# Patient Record
Sex: Female | Born: 1960 | Race: Black or African American | Hispanic: No | Marital: Married | Smoking: Current every day smoker
Health system: Southern US, Community
[De-identification: ages and names within clinical notes are randomized; demographics above are authoritative.]

## PROBLEM LIST (undated history)

## (undated) DIAGNOSIS — J42 Unspecified chronic bronchitis: Secondary | ICD-10-CM

## (undated) DIAGNOSIS — F419 Anxiety disorder, unspecified: Secondary | ICD-10-CM

## (undated) DIAGNOSIS — J40 Bronchitis, not specified as acute or chronic: Secondary | ICD-10-CM

## (undated) DIAGNOSIS — E119 Type 2 diabetes mellitus without complications: Secondary | ICD-10-CM

## (undated) DIAGNOSIS — I10 Essential (primary) hypertension: Secondary | ICD-10-CM

## (undated) HISTORY — PX: TONSILLECTOMY: SUR1361

## (undated) HISTORY — PX: CHOLECYSTECTOMY: SHX55

## (undated) HISTORY — PX: CARPAL TUNNEL RELEASE: SHX101

---

## 1997-10-22 ENCOUNTER — Encounter: Admission: RE | Admit: 1997-10-22 | Discharge: 1997-10-22 | Payer: Self-pay | Admitting: Family Medicine

## 1997-10-28 ENCOUNTER — Encounter: Admission: RE | Admit: 1997-10-28 | Discharge: 1997-10-28 | Payer: Self-pay | Admitting: Family Medicine

## 1998-01-17 ENCOUNTER — Emergency Department (HOSPITAL_COMMUNITY): Admission: EM | Admit: 1998-01-17 | Discharge: 1998-01-17 | Payer: Self-pay | Admitting: Emergency Medicine

## 1998-06-14 ENCOUNTER — Emergency Department (HOSPITAL_COMMUNITY): Admission: EM | Admit: 1998-06-14 | Discharge: 1998-06-14 | Payer: Self-pay | Admitting: Emergency Medicine

## 1998-08-03 ENCOUNTER — Emergency Department (HOSPITAL_COMMUNITY): Admission: EM | Admit: 1998-08-03 | Discharge: 1998-08-03 | Payer: Self-pay | Admitting: Emergency Medicine

## 1998-08-04 ENCOUNTER — Encounter: Admission: RE | Admit: 1998-08-04 | Discharge: 1998-08-04 | Payer: Self-pay | Admitting: Family Medicine

## 1998-08-18 ENCOUNTER — Encounter: Admission: RE | Admit: 1998-08-18 | Discharge: 1998-08-18 | Payer: Self-pay | Admitting: Family Medicine

## 1998-11-24 ENCOUNTER — Ambulatory Visit (HOSPITAL_COMMUNITY): Admission: RE | Admit: 1998-11-24 | Discharge: 1998-11-24 | Payer: Self-pay | Admitting: Internal Medicine

## 1998-11-27 ENCOUNTER — Encounter: Payer: Self-pay | Admitting: Internal Medicine

## 1998-11-27 ENCOUNTER — Ambulatory Visit (HOSPITAL_COMMUNITY): Admission: RE | Admit: 1998-11-27 | Discharge: 1998-11-27 | Payer: Self-pay | Admitting: Internal Medicine

## 2000-10-27 ENCOUNTER — Emergency Department (HOSPITAL_COMMUNITY): Admission: EM | Admit: 2000-10-27 | Discharge: 2000-10-27 | Payer: Self-pay | Admitting: Emergency Medicine

## 2000-10-27 ENCOUNTER — Encounter: Payer: Self-pay | Admitting: Emergency Medicine

## 2001-06-02 ENCOUNTER — Emergency Department (HOSPITAL_COMMUNITY): Admission: EM | Admit: 2001-06-02 | Discharge: 2001-06-02 | Payer: Self-pay | Admitting: Emergency Medicine

## 2001-10-05 ENCOUNTER — Emergency Department (HOSPITAL_COMMUNITY): Admission: EM | Admit: 2001-10-05 | Discharge: 2001-10-06 | Payer: Self-pay

## 2004-04-06 ENCOUNTER — Ambulatory Visit: Payer: Self-pay | Admitting: Family Medicine

## 2005-08-30 ENCOUNTER — Ambulatory Visit: Payer: Self-pay | Admitting: *Deleted

## 2007-11-27 ENCOUNTER — Ambulatory Visit: Payer: Self-pay | Admitting: *Deleted

## 2008-12-27 ENCOUNTER — Emergency Department: Payer: Self-pay | Admitting: Emergency Medicine

## 2009-01-05 ENCOUNTER — Inpatient Hospital Stay: Payer: Self-pay | Admitting: Surgery

## 2009-01-09 ENCOUNTER — Ambulatory Visit: Payer: Self-pay | Admitting: Surgery

## 2009-07-27 ENCOUNTER — Ambulatory Visit: Payer: Self-pay | Admitting: Orthopedic Surgery

## 2009-07-31 ENCOUNTER — Ambulatory Visit: Payer: Self-pay | Admitting: Orthopedic Surgery

## 2011-01-18 IMAGING — NM NUCLEAR MEDICINE HEPATOHBILIARY INCLUDE GB
1 series · 5 of 5 positions shown · non-contrast
Comparison: none

REASON FOR EXAM: ruq pain, gallstones
COMMENTS:

[Series 1000: gallbladder statics · 4.80mm/px · 5 of 5 slices shown]
[im 1/5]
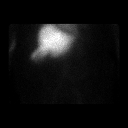
[im 2/5]
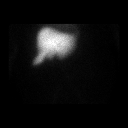
[im 3/5]
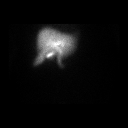
[im 4/5]
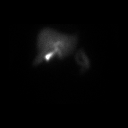
[im 5/5]
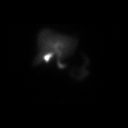

[5 of 5 positions shown; findings below may reference images not displayed]

PROCEDURE:     NM  - NM HEPATOBILIARY IMAGE  - January 06, 2009  [DATE]

RESULT:     The patient received 8.29 mCi of technetium 99m labeled Choletec
for this study. The patient has known gallstones.

There is adequate uptake of the radiopharmaceutical by the liver. The
gallbladder becomes visible by 10 minutes as is the common bile duct. Bowel
activity is clearly evident by 20 minutes.The patient was uncomfortable on
the table and the study was terminated once adequate filling of the
gallbladder and the presence of bowel activity were demonstrated.
IMPRESSION: I do not see evidence of obstruction of the cystic duct nor
evidence of common bile ductal dilation.

## 2011-01-18 IMAGING — US TRANSABDOMINAL ULTRASOUND OF PELVIS
1 series · 17 of 25 positions shown · non-contrast
Comparison: none

REASON FOR EXAM: evaluate ovarian cyst
COMMENTS:

[Series 1: transabdominal ultrasound of pelvis · 17 of 39 slices shown]
[im 1/39]
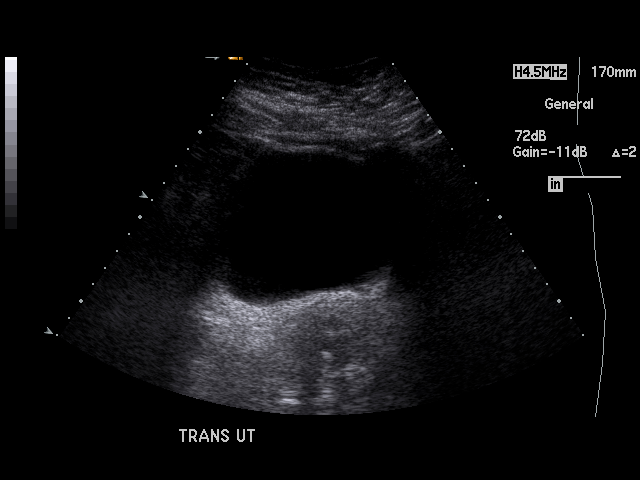
[im 4/39]
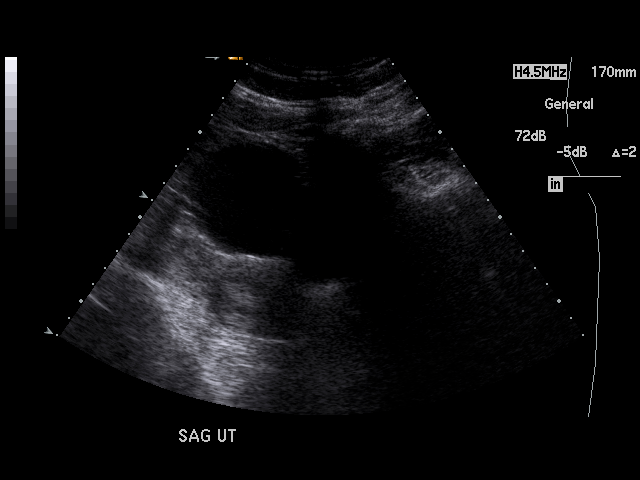
[im 5/39]
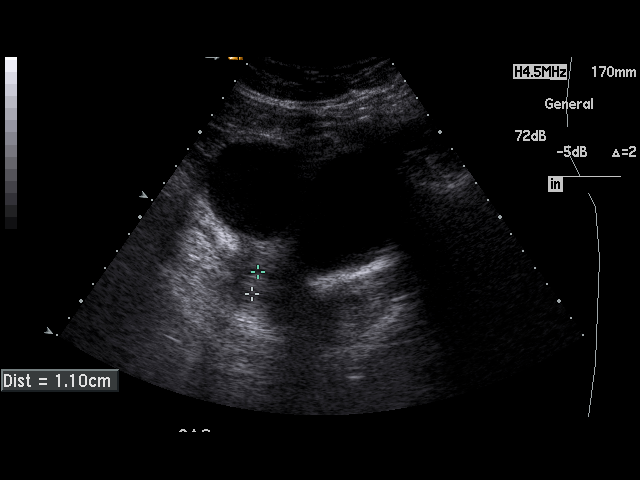
[im 8/39]
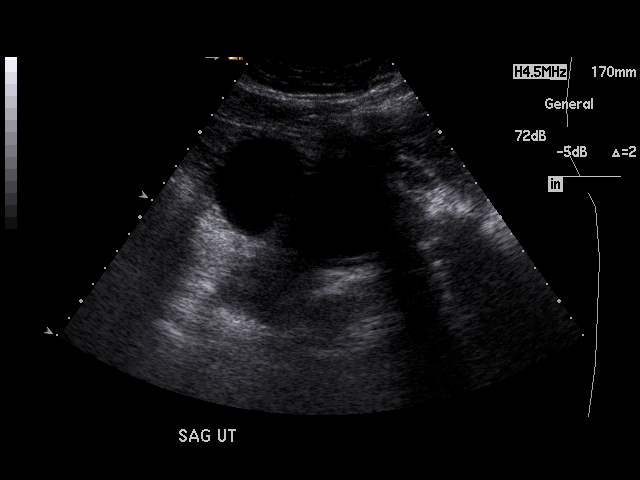
[im 10/39]
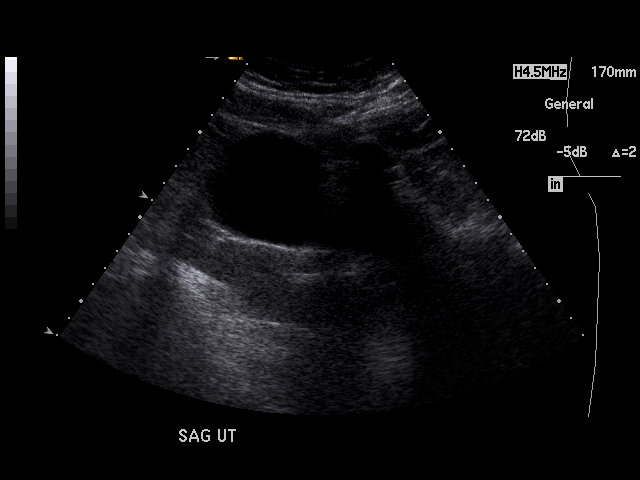
[im 13/39]
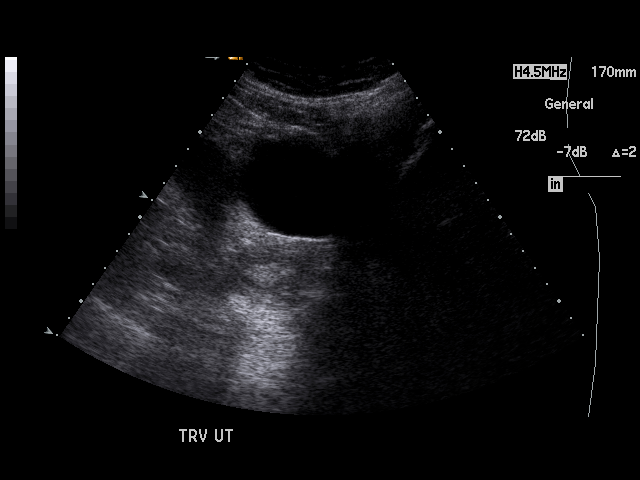
[im 15/39]
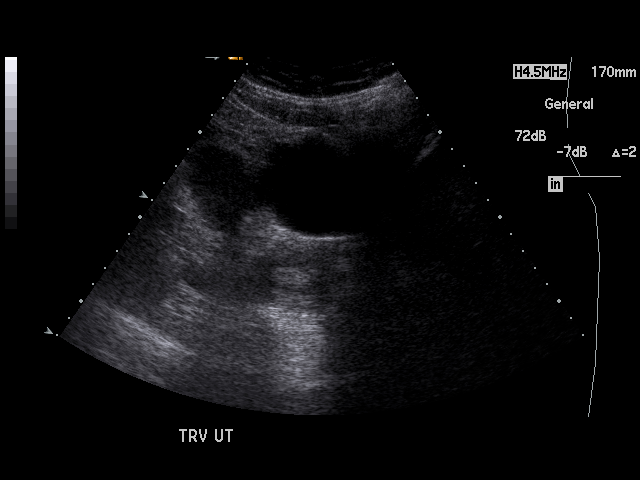
[im 18/39]
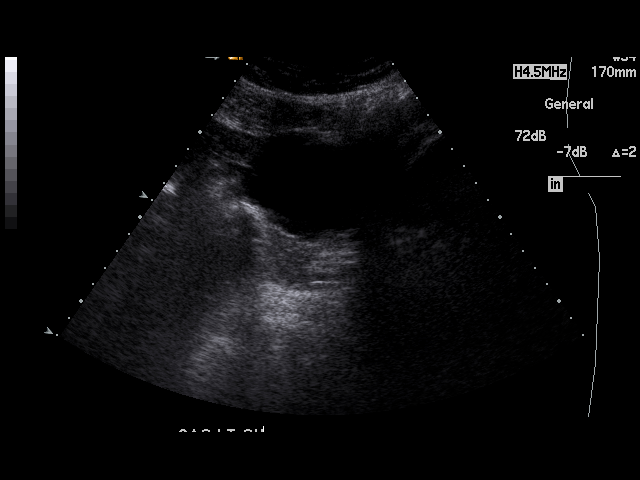
[im 20/39]
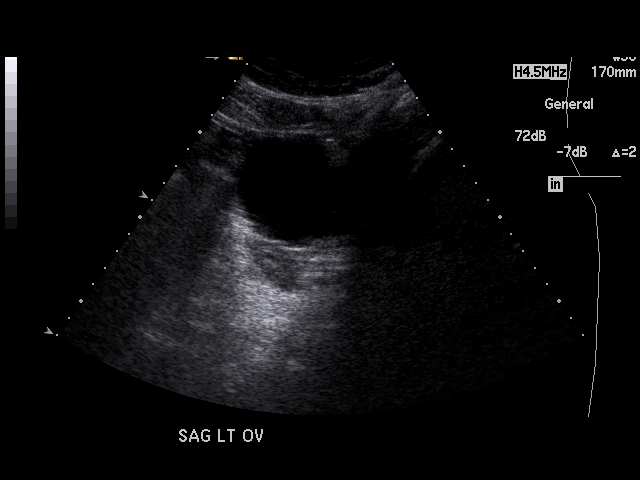
[im 21/39]
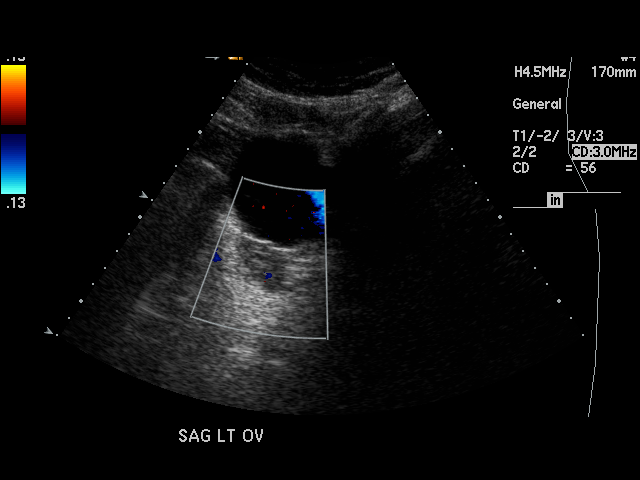
[im 24/39]
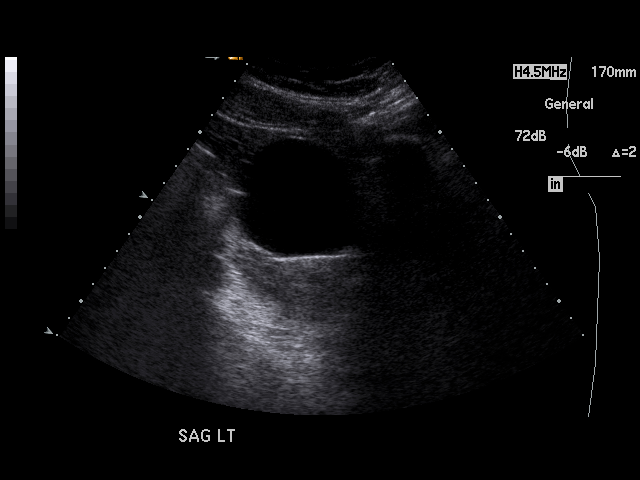
[im 26/39]
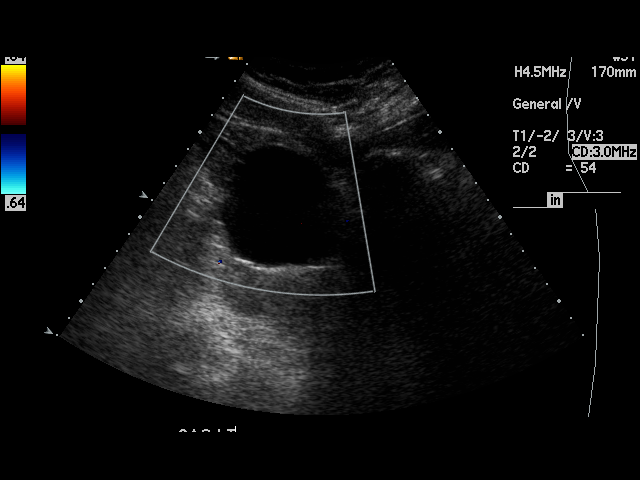
[im 29/39]
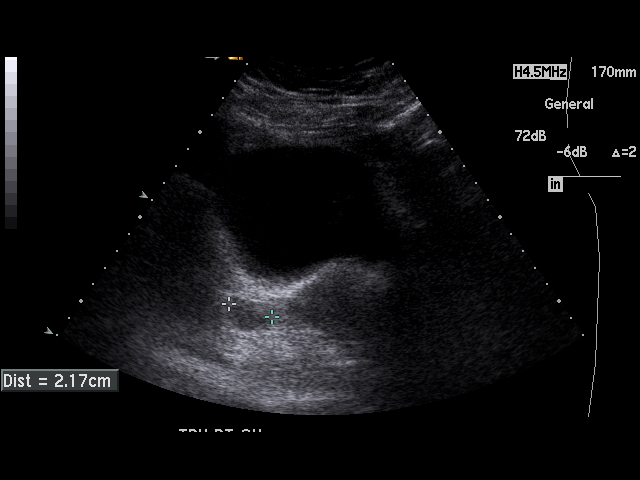
[im 31/39]
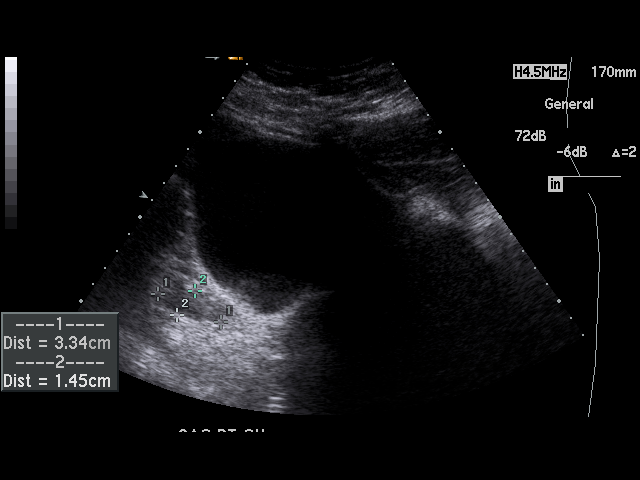
[im 34/39]
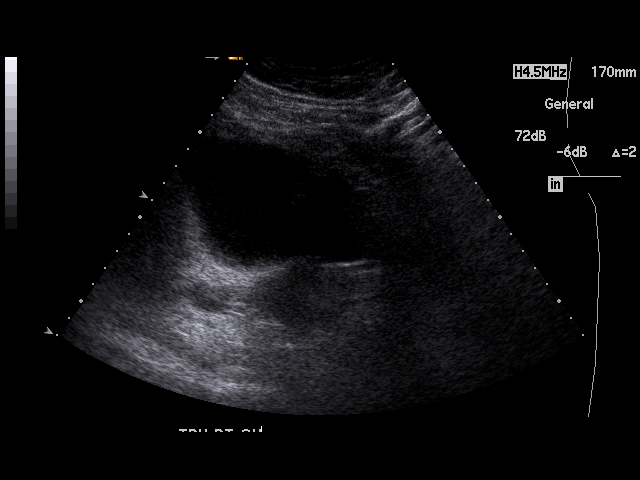
[im 35/39]
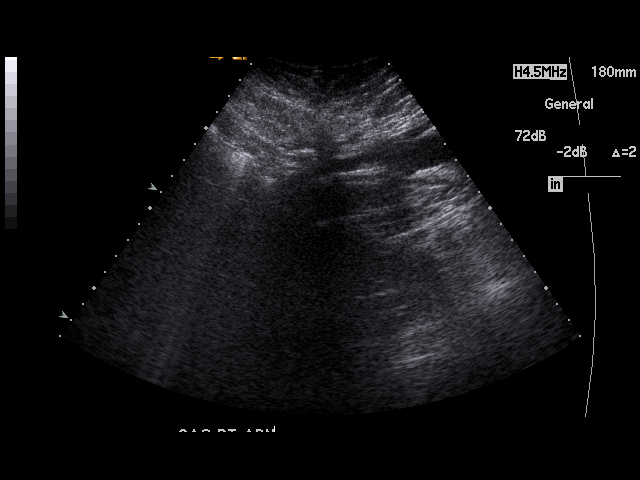
[im 39/39]
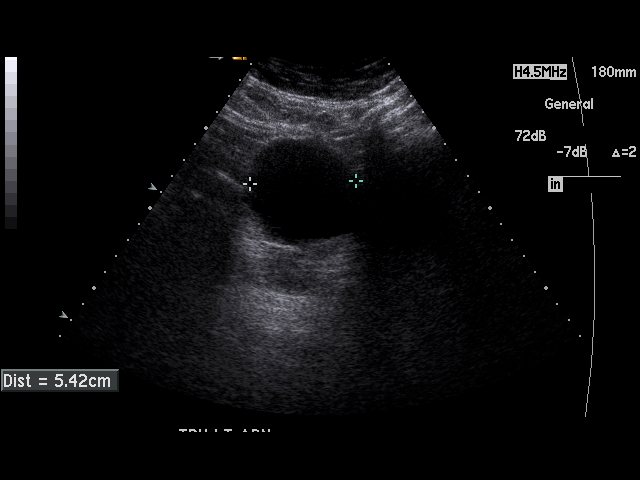

[17 of 25 positions shown; findings below may reference images not displayed]

PROCEDURE:     US  - US PELVIS EXAM  - January 06, 2009 [DATE]

RESULT:     The uterus measures 7.7 x 4.4 x 3.5 cm. Its echotexture is
normal. The endometrial stripe is mildly prominent at 11 mm. The right ovary
is normal in echotexture and measures 3.3 x 2.2 x 1.4 centimeters. The left
or measures 3.6 x 3.2 x 1.9 cm. Associated with the left ovary is a 5.7 x
5.4 x 5.9 cm simple appearing cyst.
IMPRESSION: 1. There is a simple appearing cyst measuring nearly 5.9 cm in greatest
diameter associated with the left ovary. Normal solid appearing ovarian
tissue is seen.
2. The uterus and right ovary exhibit no acute abnormality. The endometrial
stripe measures 11 mm. Correlation with timing of the patient's menstrual
cycle is needed.

## 2011-05-17 ENCOUNTER — Ambulatory Visit: Payer: Self-pay | Admitting: Family Medicine

## 2014-11-18 ENCOUNTER — Other Ambulatory Visit: Payer: Self-pay | Admitting: Family Medicine

## 2014-11-18 DIAGNOSIS — Z1239 Encounter for other screening for malignant neoplasm of breast: Secondary | ICD-10-CM

## 2014-12-02 ENCOUNTER — Ambulatory Visit: Payer: Self-pay | Attending: Family Medicine

## 2015-01-29 ENCOUNTER — Ambulatory Visit
Admission: RE | Admit: 2015-01-29 | Discharge: 2015-01-29 | Disposition: A | Discharge status: Home / Self Care | Payer: Medicaid Other | Source: Ambulatory Visit | Attending: Family Medicine | Admitting: Family Medicine

## 2015-01-29 DIAGNOSIS — Z1231 Encounter for screening mammogram for malignant neoplasm of breast: Secondary | ICD-10-CM | POA: Insufficient documentation

## 2015-01-29 DIAGNOSIS — Z1239 Encounter for other screening for malignant neoplasm of breast: Secondary | ICD-10-CM

## 2015-07-30 ENCOUNTER — Other Ambulatory Visit: Payer: Self-pay | Admitting: Nurse Practitioner

## 2015-07-30 DIAGNOSIS — Z1231 Encounter for screening mammogram for malignant neoplasm of breast: Secondary | ICD-10-CM

## 2016-02-01 ENCOUNTER — Ambulatory Visit: Payer: Medicaid Other | Attending: Nurse Practitioner

## 2016-06-07 ENCOUNTER — Other Ambulatory Visit: Payer: Self-pay | Admitting: Nurse Practitioner

## 2016-06-07 DIAGNOSIS — Z1239 Encounter for other screening for malignant neoplasm of breast: Secondary | ICD-10-CM

## 2016-07-07 ENCOUNTER — Ambulatory Visit
Admission: RE | Admit: 2016-07-07 | Discharge: 2016-07-07 | Disposition: A | Discharge status: Home / Self Care | Payer: Medicaid Other | Source: Ambulatory Visit | Attending: Nurse Practitioner | Admitting: Nurse Practitioner

## 2016-07-07 ENCOUNTER — Other Ambulatory Visit: Payer: Self-pay | Admitting: Nurse Practitioner

## 2016-07-07 DIAGNOSIS — Z1239 Encounter for other screening for malignant neoplasm of breast: Secondary | ICD-10-CM

## 2016-07-07 DIAGNOSIS — Z1231 Encounter for screening mammogram for malignant neoplasm of breast: Secondary | ICD-10-CM | POA: Insufficient documentation

## 2018-01-19 ENCOUNTER — Other Ambulatory Visit: Payer: Self-pay

## 2018-01-19 ENCOUNTER — Emergency Department
Admission: EM | Admit: 2018-01-19 | Discharge: 2018-01-19 | Disposition: A | Discharge status: Home / Self Care | Payer: Medicaid Other | Attending: Emergency Medicine | Admitting: Emergency Medicine

## 2018-01-19 DIAGNOSIS — T162XXA Foreign body in left ear, initial encounter: Secondary | ICD-10-CM | POA: Diagnosis not present

## 2018-01-19 DIAGNOSIS — F17210 Nicotine dependence, cigarettes, uncomplicated: Secondary | ICD-10-CM | POA: Insufficient documentation

## 2018-01-19 DIAGNOSIS — E119 Type 2 diabetes mellitus without complications: Secondary | ICD-10-CM | POA: Insufficient documentation

## 2018-01-19 DIAGNOSIS — Y92018 Other place in single-family (private) house as the place of occurrence of the external cause: Secondary | ICD-10-CM | POA: Insufficient documentation

## 2018-01-19 DIAGNOSIS — Y9389 Activity, other specified: Secondary | ICD-10-CM | POA: Insufficient documentation

## 2018-01-19 DIAGNOSIS — I10 Essential (primary) hypertension: Secondary | ICD-10-CM | POA: Insufficient documentation

## 2018-01-19 DIAGNOSIS — X58XXXA Exposure to other specified factors, initial encounter: Secondary | ICD-10-CM | POA: Insufficient documentation

## 2018-01-19 DIAGNOSIS — Y999 Unspecified external cause status: Secondary | ICD-10-CM | POA: Diagnosis not present

## 2018-01-19 HISTORY — DX: Essential (primary) hypertension: I10

## 2018-01-19 HISTORY — DX: Type 2 diabetes mellitus without complications: E11.9

## 2018-01-19 NOTE — ED Provider Notes (Signed)
[facility] Emergency Department Provider Note  ____________________________________________   First MD Initiated Contact with Patient 01/19/18 [phone]     (approximate)  I have reviewed the triage vital signs and the nursing notes.   HISTORY  Chief Complaint Q-tip in left ear   HPI Jamie Rodriguez is a 57 y.o. female presents to the ED with complaint of foreign body in her left ear.  Patient states that she was cleaning her left ear with a Q-tip when the cotton got stuck.  She now cannot hear anything out of her left ear.   Past Medical History:  Diagnosis Date  . Diabetes mellitus without complication ([place])   . Hypertension     There are no active problems to display for this patient.   History reviewed. No pertinent surgical history.  Prior to Admission medications   Not on File    Allergies Penicillins  Family History  Problem Relation Age of Onset  . Breast cancer Sister 68    Social History Social History   Tobacco Use  . Smoking status: Current Every Day Smoker    Packs/day: 0.50    Types: Cigarettes  . Smokeless tobacco: Never Used  Substance Use Topics  . Alcohol use: Yes    Comment: occ  . Drug use: Never    Review of Systems Constitutional: No fever/chills Eyes: No visual changes. ENT: No sore throat.  Positive foreign body left ear. Cardiovascular: Denies chest pain. Musculoskeletal: Negative for back pain. Skin: Negative for rash. Neurological: Negative for  focal weakness or numbness. ___________________________________________   PHYSICAL EXAM:  VITAL SIGNS: ED Triage Vitals [01/19/18 0930]  Enc Vitals Group     BP      Pulse      Resp      Temp      Temp src      SpO2      Weight 221 lb (100.2 kg)     Height 5\' 5"  (1.651 m)     Head Circumference      [place] Flow      Pain Score 3     Pain Loc      Pain Edu?      Excl. in [place]?    Constitutional: Alert and oriented. Well appearing and in no acute  distress. Eyes: Conjunctivae are normal.  Head: Atraumatic. ENT: Left canal does reveal a white cotton Q-tip.  No damage or bleeding is noted in the canal. Neck: No stridor.   Cardiovascular: Normal rate, regular rhythm. Grossly normal heart sounds.  Good peripheral circulation. Respiratory: Normal respiratory effort.  No retractions. Lungs CTAB. Neurologic:  Normal speech and language. No gross focal neurologic deficits are appreciated.  Skin:  Skin is warm, dry and intact.  Psychiatric: Mood and affect are normal. Speech and behavior are normal.  ____________________________________________   LABS (all labs ordered are listed, but only abnormal results are displayed)  Labs Reviewed - No data to display  PROCEDURES  Procedure(s) performed: Cotton Q-tip and was removed from the left ear canal with a pair of alligator forceps without any difficulties.  TM is intact and no bleeding was noted.  Procedures  Critical Care performed: No  ____________________________________________   INITIAL IMPRESSION / ASSESSMENT AND PLAN / ED COURSE  As part of my medical decision making, I reviewed the following data within the electronic MEDICAL RECORD NUMBER Notes from prior ED visits and [place] Controlled Substance Database  Patient presents to the ED with complaint  of cotton Q-tip that is stuck in her left ear.  Cotton and was removed from the left canal without any difficulty.  Discussed using earwax remover drops instead of using Q-tips in the future.   FINAL CLINICAL IMPRESSION(S) / ED DIAGNOSES  Final diagnoses:  Foreign body in auricle of left ear, initial encounter     ED Discharge Orders    None       Note:  This document was prepared using Dragon voice recognition software and may include unintentional dictation errors.   Johnn Hai, PA-C 01/19/18 1714  Lavonia Drafts, MD 01/20/18 [phone]

## 2018-01-19 NOTE — Discharge Instructions (Addendum)
Follow-up with Dr. Richardson Landry who is the ear specialist if you have any continued problems.  Do not put any objects in your ear.  This includes Q-tips.  If you want to clean your ears use Debrox earwax removal drops that can be found at any drugstore.

## 2018-01-19 NOTE — ED Triage Notes (Signed)
Pt reports that she was cleaning left ear out with q-tip - the cotton part of the q-tip is stuck in pt ear - she reports that she cannot ear out of the left ear

## 2019-11-15 ENCOUNTER — Other Ambulatory Visit: Payer: Self-pay | Admitting: Family Medicine

## 2019-11-15 DIAGNOSIS — Z1231 Encounter for screening mammogram for malignant neoplasm of breast: Secondary | ICD-10-CM

## 2019-12-19 ENCOUNTER — Other Ambulatory Visit: Payer: Self-pay | Admitting: Otolaryngology

## 2019-12-19 ENCOUNTER — Other Ambulatory Visit (HOSPITAL_COMMUNITY): Payer: Self-pay | Admitting: Otolaryngology

## 2019-12-19 DIAGNOSIS — R221 Localized swelling, mass and lump, neck: Secondary | ICD-10-CM

## 2020-01-01 ENCOUNTER — Ambulatory Visit: Payer: Medicaid Other

## 2020-01-07 ENCOUNTER — Ambulatory Visit
Admission: RE | Admit: 2020-01-07 | Discharge: 2020-01-07 | Disposition: A | Discharge status: Home / Self Care | Payer: Medicare Other | Source: Ambulatory Visit | Attending: Otolaryngology | Admitting: Otolaryngology

## 2020-01-07 ENCOUNTER — Other Ambulatory Visit: Payer: Self-pay

## 2020-01-07 DIAGNOSIS — R221 Localized swelling, mass and lump, neck: Secondary | ICD-10-CM | POA: Diagnosis present

## 2020-01-07 LAB — POCT I-STAT CREATININE: Creatinine, Ser: 0.7 mg/dL (ref 0.44–1.00)

## 2020-01-07 MED ORDER — IOHEXOL 300 MG/ML  SOLN
75.0000 mL | Freq: Once | INTRAMUSCULAR | Status: AC | PRN
  Administered 2020-01-07: 75 mL via INTRAVENOUS

## 2020-02-27 ENCOUNTER — Other Ambulatory Visit: Payer: Self-pay

## 2020-02-27 ENCOUNTER — Encounter
Admission: RE | Admit: 2020-02-27 | Discharge: 2020-02-27 | Disposition: A | Discharge status: Home / Self Care | Payer: Medicare Other | Source: Ambulatory Visit | Attending: Otolaryngology | Admitting: Otolaryngology

## 2020-02-27 DIAGNOSIS — E119 Type 2 diabetes mellitus without complications: Secondary | ICD-10-CM | POA: Diagnosis not present

## 2020-02-27 DIAGNOSIS — I10 Essential (primary) hypertension: Secondary | ICD-10-CM | POA: Diagnosis not present

## 2020-02-27 DIAGNOSIS — Z20822 Contact with and (suspected) exposure to covid-19: Secondary | ICD-10-CM | POA: Diagnosis not present

## 2020-02-27 DIAGNOSIS — Z01818 Encounter for other preprocedural examination: Secondary | ICD-10-CM | POA: Insufficient documentation

## 2020-02-27 HISTORY — DX: Bronchitis, not specified as acute or chronic: J40

## 2020-02-27 NOTE — Patient Instructions (Addendum)
Your procedure is scheduled on:03-03-20 TUESDAY Report to Day Surgery on the 2nd floor of the [facility]. To find out your arrival time, please call [phone] between 1PM - 3PM on:03-02-20 MONDAY  REMEMBER: Instructions that are not followed completely may result in serious medical risk, up to and including death; or upon the discretion of your surgeon and anesthesiologist your surgery may need to be rescheduled.  Do not eat food after midnight the night before surgery.  No gum chewing, lozengers or hard candies.  You may however, drink WATER up to 2 hours before you are scheduled to arrive for your surgery. Do not drink anything within 2 hours of your scheduled arrival time.  Type 1 and Type 2 diabetics should only drink water.  TAKE THESE MEDICATIONS THE MORNING OF SURGERY WITH A SIP OF WATER: -CELEXA (CITALOPRAM)  Stop Metformin 2 days prior to surgery-LAST DOSE ON 02-29-20 SATURDAY  Follow recommendations from Cardiologist, Pulmonologist or PCP regarding stopping Aspirin, Coumadin, Plavix, Eliquis, Pradaxa, or Pletal-STOP ASPIRIN NOW  One week prior to surgery: Stop Anti-inflammatories (NSAIDS) such as MOBIC (MELOXICAM), Advil, Aleve, Ibuprofen, Motrin, Naproxen, Naprosyn and Aspirin based products such as Excedrin, Goodys Powder, BC Powder-OK TO TAKE TYLENOL IF NEEDED  Stop ANY OVER THE COUNTER supplements until after surgery.  No Alcohol for 24 hours before or after surgery.  No Smoking including e-cigarettes for 24 hours prior to surgery.  No chewable tobacco products for at least 6 hours prior to surgery.  No nicotine patches on the day of surgery.  Do not use any "recreational" drugs for at least a week prior to your surgery.  Please be advised that the combination of cocaine and anesthesia may have negative outcomes, up to and including death. If you test positive for cocaine, your surgery will be cancelled.  On the morning of surgery brush your teeth with toothpaste  and water, you may rinse your mouth with mouthwash if you wish. Do not swallow any toothpaste or mouthwash.  Do not wear jewelry, make-up, hairpins, clips or nail polish.  Do not wear lotions, powders, or perfumes.   Do not shave 48 hours prior to surgery.   Contact lenses, hearing aids and dentures may not be worn into surgery.  Do not bring valuables to the hospital. [facility] is not responsible for any missing/lost belongings or valuables.   Use CHG Soap as directed on instruction sheet..   Notify your doctor if there is any change in your medical condition (cold, fever, infection).  Wear comfortable clothing (specific to your surgery type) to the hospital.  Plan for stool softeners for home use; pain medications have a tendency to cause constipation. You can also help prevent constipation by eating foods high in fiber such as fruits and vegetables and drinking plenty of fluids as your diet allows.  After surgery, you can help prevent lung complications by doing breathing exercises.  Take deep breaths and cough every 1-2 hours. Your doctor may order a device called an Incentive Spirometer to help you take deep breaths. When coughing or sneezing, hold a pillow firmly against your incision with both hands. This is called "splinting." Doing this helps protect your incision. It also decreases belly discomfort.  If you are being admitted to the hospital overnight, leave your suitcase in the car. After surgery it may be brought to your room.  If you are being discharged the day of surgery, you will not be allowed to drive home. You will need a responsible  adult (18 years or older) to drive you home and stay with you that night.   If you are taking public transportation, you will need to have a responsible adult (18 years or older) with you. Please confirm with your physician that it is acceptable to use public transportation.   Please call the [facility] Dept. at [phone] if you have any questions about these instructions.  Visitation Policy:  Patients undergoing a surgery or procedure may have one family member or support person with them as long as that person is not COVID-19 positive or experiencing its symptoms.  That person may remain in the waiting area during the procedure.  Inpatient Visitation Update:   In an effort to ensure the safety of our team members and our patients, we are implementing a change to our visitation policy:  Effective Monday, Aug. 9, at 7 a.m., inpatients will be allowed one support person.  o The support person may change daily.  o The support person must pass our screening, gel in and out, and wear a mask at all times, including in the patient's room.  o Patients must also wear a mask when staff or their support person are in the room.  o Masking is required regardless of vaccination status.  Systemwide, no visitors 17 or younger.

## 2020-02-28 ENCOUNTER — Encounter
Admission: RE | Admit: 2020-02-28 | Discharge: 2020-02-28 | Disposition: A | Discharge status: Home / Self Care | Payer: Medicare Other | Source: Ambulatory Visit | Attending: Otolaryngology | Admitting: Otolaryngology

## 2020-02-28 ENCOUNTER — Other Ambulatory Visit: Payer: Medicare Other

## 2020-02-28 DIAGNOSIS — I10 Essential (primary) hypertension: Secondary | ICD-10-CM | POA: Insufficient documentation

## 2020-02-28 DIAGNOSIS — E119 Type 2 diabetes mellitus without complications: Secondary | ICD-10-CM | POA: Insufficient documentation

## 2020-02-28 DIAGNOSIS — Z01818 Encounter for other preprocedural examination: Secondary | ICD-10-CM | POA: Insufficient documentation

## 2020-02-28 DIAGNOSIS — Z20822 Contact with and (suspected) exposure to covid-19: Secondary | ICD-10-CM | POA: Insufficient documentation

## 2020-02-28 LAB — BASIC METABOLIC PANEL
Anion gap: 9 (ref 5–15)
BUN: 16 mg/dL (ref 8–23)
CO2: 26 mmol/L (ref 22–32)
Calcium: 9 mg/dL (ref 8.9–10.3)
Chloride: 101 mmol/L (ref 98–111)
Creatinine, Ser: 0.91 mg/dL (ref 0.44–1.00)
GFR, Estimated: >60 mL/min (ref >60)
Glucose, Bld: 262 mg/dL — ABNORMAL HIGH (ref 70–99)
Potassium: 4.1 mmol/L (ref 3.5–5.1)
Sodium: 136 mmol/L (ref 135–145)

## 2020-02-28 LAB — SARS CORONAVIRUS 2 (TAT 6-24 HRS): SARS Coronavirus 2: NEGATIVE

## 2020-02-28 NOTE — Progress Notes (Signed)
  [facility] Perioperative Services: Pre-Admission/Anesthesia Testing  Abnormal Lab Notification    Date: 02/28/20  Name: Jamie Rodriguez MRN:   [id number]  Re: Abnormal labs noted during PAT appointment   Provider(s) Notified: Carloyn Manner, MD and Marguerita Merles, MD   Notification mode: Routed and/or faxed via [place] LAB VALUE(S): Lab Results  Component Value Date   GLUCOSE 262 (H) 02/28/2020    Notes: Patient with a T2DM diagnosis. She is currently on both oral (metformin and glipizide) and parenteral (luraglutide) therapies. No previous Hgb A1c available for review. We are not able to add to today's labs as correct tube was not collected during venipuncture.   This communication is being sent in order to determine if patient is deemed to have adequate preoperative glycemic control in efforts to reduce her risk of developing SSI; sent to surgeon and PCP. With that being said, the benefit of improving glycemic control must be weighed against the overall risk associated with delaying a necessary elective surgery for this patient. This is a Community education officer; no formal response is required.  Honor Loh, MSN, APRN, FNP-C, CEN [facility]  Peri-operative Services Nurse Practitioner Phone: [phone] 02/28/20 2:25 PM

## 2020-03-03 ENCOUNTER — Ambulatory Visit: Admission: RE | Admit: 2020-03-03 | Payer: Medicare Other | Source: Home / Self Care | Admitting: Otolaryngology

## 2020-03-03 ENCOUNTER — Encounter: Admission: RE | Payer: Self-pay | Source: Home / Self Care

## 2020-03-03 SURGERY — EXCISION LIPOMA
Anesthesia: General | Laterality: Right

## 2020-09-14 ENCOUNTER — Other Ambulatory Visit: Payer: Self-pay | Admitting: Family Medicine

## 2020-09-14 DIAGNOSIS — Z1231 Encounter for screening mammogram for malignant neoplasm of breast: Secondary | ICD-10-CM

## 2021-11-04 ENCOUNTER — Emergency Department: Payer: Medicare Other

## 2021-11-04 ENCOUNTER — Inpatient Hospital Stay
Admission: EM | Admit: 2021-11-04 | Discharge: 2021-11-08 | DRG: 853 | Disposition: A | Discharge status: Home Health | Payer: Medicare Other | Attending: Internal Medicine | Admitting: Internal Medicine

## 2021-11-04 ENCOUNTER — Other Ambulatory Visit: Payer: Self-pay

## 2021-11-04 ENCOUNTER — Encounter: Payer: Self-pay | Admitting: Emergency Medicine

## 2021-11-04 DIAGNOSIS — L03312 Cellulitis of back [any part except buttock]: Secondary | ICD-10-CM | POA: Diagnosis present

## 2021-11-04 DIAGNOSIS — J42 Unspecified chronic bronchitis: Secondary | ICD-10-CM | POA: Diagnosis present

## 2021-11-04 DIAGNOSIS — Z791 Long term (current) use of non-steroidal anti-inflammatories (NSAID): Secondary | ICD-10-CM

## 2021-11-04 DIAGNOSIS — Z79899 Other long term (current) drug therapy: Secondary | ICD-10-CM

## 2021-11-04 DIAGNOSIS — Z880 Allergy status to penicillin: Secondary | ICD-10-CM

## 2021-11-04 DIAGNOSIS — Z91199 Patient's noncompliance with other medical treatment and regimen due to unspecified reason: Secondary | ICD-10-CM

## 2021-11-04 DIAGNOSIS — L03317 Cellulitis of buttock: Secondary | ICD-10-CM | POA: Diagnosis not present

## 2021-11-04 DIAGNOSIS — Z7984 Long term (current) use of oral hypoglycemic drugs: Secondary | ICD-10-CM | POA: Diagnosis not present

## 2021-11-04 DIAGNOSIS — I10 Essential (primary) hypertension: Secondary | ICD-10-CM | POA: Diagnosis present

## 2021-11-04 DIAGNOSIS — L0231 Cutaneous abscess of buttock: Secondary | ICD-10-CM | POA: Diagnosis present

## 2021-11-04 DIAGNOSIS — E669 Obesity, unspecified: Secondary | ICD-10-CM | POA: Diagnosis present

## 2021-11-04 DIAGNOSIS — A419 Sepsis, unspecified organism: Principal | ICD-10-CM | POA: Diagnosis present

## 2021-11-04 DIAGNOSIS — L89153 Pressure ulcer of sacral region, stage 3: Secondary | ICD-10-CM | POA: Diagnosis not present

## 2021-11-04 DIAGNOSIS — R739 Hyperglycemia, unspecified: Secondary | ICD-10-CM

## 2021-11-04 DIAGNOSIS — L0390 Cellulitis, unspecified: Secondary | ICD-10-CM | POA: Diagnosis present

## 2021-11-04 DIAGNOSIS — Z7982 Long term (current) use of aspirin: Secondary | ICD-10-CM

## 2021-11-04 DIAGNOSIS — F17210 Nicotine dependence, cigarettes, uncomplicated: Secondary | ICD-10-CM | POA: Diagnosis present

## 2021-11-04 DIAGNOSIS — E1165 Type 2 diabetes mellitus with hyperglycemia: Secondary | ICD-10-CM | POA: Diagnosis present

## 2021-11-04 DIAGNOSIS — Z720 Tobacco use: Secondary | ICD-10-CM

## 2021-11-04 DIAGNOSIS — Z6837 Body mass index (BMI) 37.0-37.9, adult: Secondary | ICD-10-CM

## 2021-11-04 DIAGNOSIS — E119 Type 2 diabetes mellitus without complications: Secondary | ICD-10-CM

## 2021-11-04 LAB — CBC WITH DIFFERENTIAL/PLATELET
Abs Immature Granulocytes: 0.1 10*3/uL — ABNORMAL HIGH (ref 0.00–0.07)
Basophils Absolute: 0.1 10*3/uL (ref 0.0–0.1)
Basophils Relative: 0 %
Eosinophils Absolute: 0.1 10*3/uL (ref 0.0–0.5)
Eosinophils Relative: 0 %
HCT: 39.8 % (ref 36.0–46.0)
Hemoglobin: 12.8 g/dL (ref 12.0–15.0)
Immature Granulocytes: 1 %
Lymphocytes Relative: 13 %
Lymphs Abs: 2.6 10*3/uL (ref 0.7–4.0)
MCH: 28.4 pg (ref 26.0–34.0)
MCHC: 32.2 g/dL (ref 30.0–36.0)
MCV: 88.4 fL (ref 80.0–100.0)
Monocytes Absolute: 2.2 10*3/uL — ABNORMAL HIGH (ref 0.1–1.0)
Monocytes Relative: 11 %
Neutro Abs: 15.6 10*3/uL — ABNORMAL HIGH (ref 1.7–7.7)
Neutrophils Relative %: 75 %
Platelets: 304 10*3/uL (ref 150–400)
RBC: 4.5 MIL/uL (ref 3.87–5.11)
RDW: 12.3 % (ref 11.5–15.5)
WBC: 20.6 10*3/uL — ABNORMAL HIGH (ref 4.0–10.5)
nRBC: 0 % (ref 0.0–0.2)

## 2021-11-04 LAB — COMPREHENSIVE METABOLIC PANEL
ALT: 26 U/L (ref 0–44)
AST: 31 U/L (ref 15–41)
Albumin: 3.2 g/dL — ABNORMAL LOW (ref 3.5–5.0)
Alkaline Phosphatase: 86 U/L (ref 38–126)
Anion gap: 11 (ref 5–15)
BUN: 12 mg/dL (ref 8–23)
CO2: 23 mmol/L (ref 22–32)
Calcium: 9.2 mg/dL (ref 8.9–10.3)
Chloride: 98 mmol/L (ref 98–111)
Creatinine, Ser: 0.81 mg/dL (ref 0.44–1.00)
GFR, Estimated: >60 mL/min (ref >60)
Glucose, Bld: 253 mg/dL — ABNORMAL HIGH (ref 70–99)
Potassium: 3.8 mmol/L (ref 3.5–5.1)
Sodium: 132 mmol/L — ABNORMAL LOW (ref 135–145)
Total Bilirubin: 0.6 mg/dL (ref 0.3–1.2)
Total Protein: 7.9 g/dL (ref 6.5–8.1)

## 2021-11-04 LAB — URINALYSIS, COMPLETE (UACMP) WITH MICROSCOPIC
Bilirubin Urine: NEGATIVE
Glucose, UA: >=500 mg/dL — AB
Hgb urine dipstick: NEGATIVE
Ketones, ur: 5 mg/dL — AB
Leukocytes,Ua: NEGATIVE
Nitrite: NEGATIVE
Protein, ur: 30 mg/dL — AB
Specific Gravity, Urine: 1.022 (ref 1.005–1.030)
pH: 5 (ref 5.0–8.0)

## 2021-11-04 LAB — APTT: aPTT: 31 seconds (ref 24–36)

## 2021-11-04 LAB — HEMOGLOBIN A1C
Hgb A1c MFr Bld: 12.6 % — ABNORMAL HIGH (ref 4.8–5.6)
Mean Plasma Glucose: 314.92 mg/dL

## 2021-11-04 LAB — PROTIME-INR
INR: 1.2 (ref 0.8–1.2)
Prothrombin Time: 15.3 seconds — ABNORMAL HIGH (ref 11.4–15.2)

## 2021-11-04 LAB — LACTIC ACID, PLASMA
Lactic Acid, Venous: 2.1 mmol/L (ref 0.5–1.9)
Lactic Acid, Venous: 1.2 mmol/L (ref 0.5–1.9)

## 2021-11-04 LAB — TSH: TSH: 1.278 u[IU]/mL (ref 0.350–4.500)

## 2021-11-04 LAB — CBG MONITORING, ED: Glucose-Capillary: 124 mg/dL — ABNORMAL HIGH (ref 70–99)

## 2021-11-04 LAB — PROCALCITONIN: Procalcitonin: 0.16 ng/mL

## 2021-11-04 MED ORDER — ACETAMINOPHEN 10 MG/ML IV SOLN
1000.0000 mg | INTRAVENOUS | Status: AC
  Administered 2021-11-04: 1000 mg via INTRAVENOUS
  Filled 2021-11-04: qty 100

## 2021-11-04 MED ORDER — ACETAMINOPHEN 650 MG RE SUPP: 650.0000 mg | Freq: Four times a day (QID) | RECTAL | Status: DC | PRN

## 2021-11-04 MED ORDER — VANCOMYCIN HCL IN DEXTROSE 1-5 GM/200ML-% IV SOLN: 1000.0000 mg | Freq: Once | INTRAVENOUS | Status: DC

## 2021-11-04 MED ORDER — INSULIN ASPART 100 UNIT/ML IJ SOLN
0.0000 [IU] | INTRAMUSCULAR | Status: DC
  Administered 2021-11-04: 2 [IU] via SUBCUTANEOUS
  Administered 2021-11-04: 8 [IU] via SUBCUTANEOUS
  Administered 2021-11-05: 5 [IU] via SUBCUTANEOUS
  Administered 2021-11-05 (×2): 3 [IU] via SUBCUTANEOUS
  Administered 2021-11-05 – 2021-11-06 (×2): 8 [IU] via SUBCUTANEOUS
  Administered 2021-11-06: 15 [IU] via SUBCUTANEOUS
  Filled 2021-11-04 (×9): qty 1

## 2021-11-04 MED ORDER — VANCOMYCIN HCL 2000 MG/400ML IV SOLN
2000.0000 mg | Freq: Once | INTRAVENOUS | Status: AC
  Administered 2021-11-04: 2000 mg via INTRAVENOUS
  Filled 2021-11-04 (×2): qty 400

## 2021-11-04 MED ORDER — ALBUTEROL SULFATE (2.5 MG/3ML) 0.083% IN NEBU: 2.5000 mg | INHALATION_SOLUTION | RESPIRATORY_TRACT | Status: DC | PRN

## 2021-11-04 MED ORDER — LACTATED RINGERS IV BOLUS
1000.0000 mL | Freq: Once | INTRAVENOUS | Status: AC
  Administered 2021-11-04: 1000 mL via INTRAVENOUS

## 2021-11-04 MED ORDER — KETOROLAC TROMETHAMINE 30 MG/ML IJ SOLN
15.0000 mg | Freq: Once | INTRAMUSCULAR | Status: AC
Start: 2021-11-04 — End: 2021-11-04
  Administered 2021-11-04: 15 mg via INTRAVENOUS
  Filled 2021-11-04: qty 1

## 2021-11-04 MED ORDER — SODIUM CHLORIDE 0.9 % IV SOLN
2.0000 g | Freq: Three times a day (TID) | INTRAVENOUS | Status: DC
  Administered 2021-11-05 – 2021-11-08 (×10): 2 g via INTRAVENOUS
  Filled 2021-11-04 (×8): qty 12.5
  Filled 2021-11-04: qty 2
  Filled 2021-11-04 (×3): qty 12.5

## 2021-11-04 MED ORDER — ACETAMINOPHEN 325 MG PO TABS: 650.0000 mg | ORAL_TABLET | Freq: Four times a day (QID) | ORAL | Status: DC | PRN

## 2021-11-04 MED ORDER — SODIUM CHLORIDE 0.9 % IV SOLN
2.0000 g | Freq: Once | INTRAVENOUS | Status: AC
  Administered 2021-11-04: 2 g via INTRAVENOUS
  Filled 2021-11-04: qty 12.5

## 2021-11-04 MED ORDER — OXYCODONE HCL 5 MG PO TABS
5.0000 mg | ORAL_TABLET | ORAL | Status: DC | PRN
  Administered 2021-11-07: 5 mg via ORAL
  Filled 2021-11-04: qty 1

## 2021-11-04 MED ORDER — IOHEXOL 300 MG/ML  SOLN
100.0000 mL | Freq: Once | INTRAMUSCULAR | Status: AC | PRN
  Administered 2021-11-04: 100 mL via INTRAVENOUS

## 2021-11-04 MED ORDER — ONDANSETRON HCL 4 MG/2ML IJ SOLN
4.0000 mg | Freq: Four times a day (QID) | INTRAMUSCULAR | Status: DC | PRN
  Administered 2021-11-05: 4 mg via INTRAVENOUS
  Filled 2021-11-04: qty 2

## 2021-11-04 MED ORDER — VANCOMYCIN HCL 1250 MG/250ML IV SOLN
1250.0000 mg | INTRAVENOUS | Status: DC
  Administered 2021-11-05 – 2021-11-08 (×4): 1250 mg via INTRAVENOUS
  Filled 2021-11-04 (×6): qty 250

## 2021-11-04 MED ORDER — ONDANSETRON HCL 4 MG PO TABS: 4.0000 mg | ORAL_TABLET | Freq: Four times a day (QID) | ORAL | Status: DC | PRN

## 2021-11-04 MED ORDER — SODIUM CHLORIDE 0.9 % IV SOLN: INTRAVENOUS | Status: AC

## 2021-11-04 MED ORDER — HEPARIN SODIUM (PORCINE) 5000 UNIT/ML IJ SOLN
5000.0000 [IU] | Freq: Three times a day (TID) | INTRAMUSCULAR | Status: DC
Start: 2021-11-04 — End: 2021-11-05
  Administered 2021-11-04: 5000 [IU] via SUBCUTANEOUS
  Filled 2021-11-04: qty 1

## 2021-11-04 NOTE — Progress Notes (Signed)
Pharmacy Antibiotic Note  Jamie Rodriguez is a 61 y.o. female w/ PMH of HTN, DM admitted on 11/04/2021 with cellulitis.  Pharmacy has been consulted for vancomycin dosing. 2000 mg IV vancomycin administered in the ED  Plan: start vancomycin 1250 mg IV Q 24 hrs Goal AUC 400-550 Expected AUC: 469.1 SCr used: 0.81 mg/dL Ke: 0.058 h-1, T1/2 11.9h   Height: '5\' 5"'$  (165.1 cm) Weight: 91.6 kg (202 lb) IBW/kg (Calculated) : 57  Temp (24hrs), Avg:99.7 F (37.6 C), Min:98.5 F (36.9 C), Max:100.9 F (38.3 C)  Recent Labs  Lab 11/04/21 1615  WBC 20.6*  CREATININE 0.81  LATICACIDVEN 2.1*    Estimated Creatinine Clearance: 80.5 mL/min (by C-G formula based on SCr of 0.81 mg/dL).    Allergies  Allergen Reactions   Penicillins Rash    Antimicrobials this admission: 07/13 vancomycin >>  07/13 cefepime >>   Microbiology results: 07/13 BCx: pending 07/13 WCx: pending   Thank you for allowing pharmacy to be a part of this patient's care.  Dallie Piles 11/04/2021 6:55 PM

## 2021-11-04 NOTE — Sepsis Progress Note (Signed)
Notified provider of need to order repeat lactic acid. ° °

## 2021-11-04 NOTE — Sepsis Progress Note (Signed)
Sepsis protocol monitored by eLink 

## 2021-11-04 NOTE — Consult Note (Signed)
CODE SEPSIS - PHARMACY COMMUNICATION  **Broad Spectrum Antibiotics should be administered within 1 hour of Sepsis diagnosis**  Time Code Sepsis Called/[place] Received: 1734  Antibiotics Ordered: cefepime and vancomycin  Time of 1st antibiotic administration: 1655  Additional action taken by pharmacy: none   Oswald Hillock ,PharmD Clinical Pharmacist  11/04/2021  5:44 PM

## 2021-11-04 NOTE — ED Triage Notes (Signed)
Pt via EMS from [facility] pt has an abscess on her bottom for the past two weeks. Pt has generalized body aches and headache for the past week. EMS gave '75mg'$  of Fentanyl and 1L of LR en route. Pt is A&Ox4 and but seems uncomfortable, pt tossing and turning in the recliner

## 2021-11-04 NOTE — Consult Note (Signed)
PHARMACY -  BRIEF ANTIBIOTIC NOTE   Pharmacy has received consult(s) for sepsis from an ED provider.  The patient's profile has been reviewed for ht/wt/allergies/indication/available labs.    One time order(s) placed for cefepime and vancomycin  Further antibiotics/pharmacy consults should be ordered by admitting physician if indicated.                       Thank you, Jamie Rodriguez 11/04/2021  5:08 PM

## 2021-11-04 NOTE — H&P (Signed)
History and Physical    Jamie Rodriguez PFX:[id number] DOB: [birth year] DOA: 11/04/2021  PCP: Marguerita Merles, MD  Patient coming from: home  I have personally briefly reviewed patient's old medical records in [place]  Chief Complaint: gluteal abscess progressive x 2 weeks  HPI: Jamie Rodriguez is a 61 y.o. female with medical history significant of  Chronic bronchitis, DMII not on insulin , HTN , recurrent abscess  who presents to ED BIB EMS due to complaint of gluteal abscess progressive x 2 weeks.  She notes associated generalized body aches  and HA over the past week as well as increasing pain and drainage. IN the field patient was treated with 75 mg of fentanyl and 1L of LR. Patient notes mild nausea but no emesis, she denies any trauma to the area. She does admit to having recurrent boils but notes usually 6-1 year.  In reference to her diabetes she notes that she had not been complaint with her long acting insulin over 2 weeks.  ED Course:  In ED patient is note to be in pain and uncomfortable  Vitals:100.9, BP 124/67, hr 101, rr 18 sat 96%  Labs: EKG: sinus , short prn poor tracing  UA:+glu , rare bacteria  wbc 6-10,  NA :132(136), K 3.8, gly 253, cr 0.81 Lactic 2.1 Wbc: 20, hgb 12.8pmn 15.6, CT abd/pelvis IMPRESSION: Extensive skin thickening and subcutaneous soft tissue swelling along the lower back, as can be seen in cellulitis. No soft tissue gas or evidence of soft tissue abscess.   Enlarged left inguinal and external iliac lymph nodes, could be reactive. Recommend follow-up ultrasound in 3 months to ensure resolution.   Tx iv tylenol 1gram x 1 , LR 1L, cefepime, vanc ketoralac, insulin Review of Systems: As per HPI otherwise 10 point review of systems negative.   Past Medical History:  Diagnosis Date   Bronchitis    chronic   Diabetes mellitus without complication ([place])    Hypertension     Past Surgical History:  Procedure Laterality Date   CARPAL  TUNNEL RELEASE Right    CHOLECYSTECTOMY     TONSILLECTOMY     age 67 or 23     reports that she has been smoking cigarettes. She has a 15.00 pack-year smoking history. She has never used smokeless tobacco. She reports that she does not currently use alcohol. She reports that she does not use drugs.  Allergies  Allergen Reactions   Penicillins Rash    Family History  Problem Relation Age of Onset   Breast cancer Sister 39    Prior to Admission medications   Medication Sig Start Date End Date Taking? Authorizing Provider  aspirin EC 81 MG tablet Take 81 mg by mouth 4 (four) times a week. Swallow whole.    [provider]  citalopram (CELEXA) 10 MG tablet Take 10 mg by mouth daily.  12/16/19   [provider]  glipiZIDE (GLUCOTROL XL) 10 MG 24 hr tablet Take 20 mg by mouth daily. 12/16/19   [provider]  hydrochlorothiazide (HYDRODIURIL) 25 MG tablet Take 25 mg by mouth daily. 12/16/19   [provider]  liraglutide (VICTOZA) 18 MG/3ML SOPN Inject 1.2 mg into the skin once a week.     [provider]  meloxicam (MOBIC) 15 MG tablet Take 15 mg by mouth daily. 12/16/19   [provider]  metFORMIN (GLUCOPHAGE) 1000 MG tablet Take 1,000 mg by mouth 2 (two) times daily. 12/16/19  [provider]    Physical Exam: Vitals:   11/04/21 1630 11/04/21 1706 11/04/21 1800 11/04/21 1830  BP: 122/74  120/70 122/74  Pulse: (!) 101  87 86  Resp: '18  17 17  '$ Temp:   98.5 F (36.9 C)   TempSrc:   Oral   SpO2: 100%  98% 97%  Weight:  91.6 kg    Height:  '5\' 5"'$  (1.651 m)       Vitals:   11/04/21 1630 11/04/21 1706 11/04/21 1800 11/04/21 1830  BP: 122/74  120/70 122/74  Pulse: (!) 101  87 86  Resp: '18  17 17  '$ Temp:   98.5 F (36.9 C)   TempSrc:   Oral   SpO2: 100%  98% 97%  Weight:  91.6 kg    Height:  '5\' 5"'$  (1.651 m)    Constitutional: NAD, calm, comfortable Eyes: PERRL, lids and conjunctivae normal ENMT: Mucous membranes  are moist. Posterior pharynx clear of any exudate or lesions.Normal dentition.  Neck: normal, supple, no masses, no thyromegaly Respiratory: clear to auscultation bilaterally, no wheezing, no crackles. Normal respiratory effort. No accessory muscle use.  Cardiovascular: Regular rate and rhythm, no murmurs / rubs / gallops. No extremity edema. 2+ pedal pulses. No carotid bruits.  Abdomen: no tenderness, no masses palpated. No hepatosplenomegaly. Bowel sounds positive.  Musculoskeletal: no clubbing / cyanosis. No joint deformity upper and lower extremities. Good ROM, no contractures. Normal muscle tone.  Skin: gluteal cleft cellulitis with associated central area of necrosis with drainage, noted significant induration > 10 cm GD Neurologic: CN 2-12 grossly intact. Sensation intact, . Strength 5/5 in all 4.  Psychiatric: Normal judgment and insight. Alert and oriented x 3. Normal mood.    Labs on Admission: I have personally reviewed following labs and imaging studies  CBC: Recent Labs  Lab 11/04/21 1615  WBC 20.6*  NEUTROABS 15.6*  HGB 12.8  HCT 39.8  MCV 88.4  PLT 379   Basic Metabolic Panel: Recent Labs  Lab 11/04/21 1615  NA 132*  K 3.8  CL 98  CO2 23  GLUCOSE 253*  BUN 12  CREATININE 0.81  CALCIUM 9.2   GFR: Estimated Creatinine Clearance: 80.5 mL/min (by C-G formula based on SCr of 0.81 mg/dL). Liver Function Tests: Recent Labs  Lab 11/04/21 1615  AST 31  ALT 26  ALKPHOS 86  BILITOT 0.6  PROT 7.9  ALBUMIN 3.2*   No results for input(s): "LIPASE", "AMYLASE" in the last 168 hours. No results for input(s): "AMMONIA" in the last 168 hours. Coagulation Profile: Recent Labs  Lab 11/04/21 1615  INR 1.2   Cardiac Enzymes: No results for input(s): "CKTOTAL", "CKMB", "CKMBINDEX", "TROPONINI" in the last 168 hours. BNP (last 3 results) No results for input(s): "PROBNP" in the last 8760 hours. HbA1C: No results for input(s): "HGBA1C" in the last 72  hours. CBG: No results for input(s): "GLUCAP" in the last 168 hours. Lipid Profile: No results for input(s): "CHOL", "HDL", "LDLCALC", "TRIG", "CHOLHDL", "LDLDIRECT" in the last 72 hours. Thyroid Function Tests: No results for input(s): "TSH", "T4TOTAL", "FREET4", "T3FREE", "THYROIDAB" in the last 72 hours. Anemia Panel: No results for input(s): "VITAMINB12", "FOLATE", "FERRITIN", "TIBC", "IRON", "RETICCTPCT" in the last 72 hours. Urine analysis:    Component Value Date/Time   COLORURINE AMBER (A) 11/04/2021 1615   APPEARANCEUR HAZY (A) 11/04/2021 1615   LABSPEC 1.022 11/04/2021 1615   PHURINE 5.0 11/04/2021 1615   GLUCOSEU >=500 (A) 11/04/2021 1615   HGBUR NEGATIVE 11/04/2021  [place] 11/04/2021 1615   KETONESUR 5 (A) 11/04/2021 1615   PROTEINUR 30 (A) 11/04/2021 1615   NITRITE NEGATIVE 11/04/2021 1615   LEUKOCYTESUR NEGATIVE 11/04/2021 1615    Radiological Exams on Admission: CT ABDOMEN PELVIS W CONTRAST  Result Date: 11/04/2021 CLINICAL DATA:  Sepsis back abscess EXAM: CT ABDOMEN AND PELVIS WITH CONTRAST TECHNIQUE: Multidetector CT imaging of the abdomen and pelvis was performed using the standard protocol following bolus administration of intravenous contrast. RADIATION DOSE REDUCTION: This exam was performed according to the departmental dose-optimization program which includes automated exposure control, adjustment of the mA and/or kV according to patient size and/or use of iterative reconstruction technique. CONTRAST:  140m OMNIPAQUE IOHEXOL 300 MG/ML  SOLN COMPARISON:  None Available. FINDINGS: Lower chest: No acute abnormality. Hepatobiliary: No focal liver abnormality is seen. Prior cholecystectomy. Pancreas: Unremarkable. No pancreatic ductal dilatation or surrounding inflammatory changes. Spleen: Normal in size without focal abnormality. Unchanged adjacent splenule. Adrenals/Urinary Tract: Adrenal glands are unremarkable. No hydronephrosis or  nephrolithiasis. Tiny right renal cysts, too small to characterize. The bladder is moderately distended. Stomach/Bowel: The stomach is within normal limits. There is no evidence of bowel obstruction.The appendix is normal. Vascular/Lymphatic: Aortoiliac atherosclerosis. No AAA. There is an enlarged left inguinal lymph node measuring up to 1.6 cm short axis (series 2, image 82). There is an enlarged left external iliac lymph node measuring up to 1.7 cm short axis (series 2, image 72). Reproductive: Unremarkable. Other: No abdominal wall hernia or abnormality. No abdominopelvic ascites. Musculoskeletal: There is no acute osseous abnormality. There are multilevel degenerative changes of the spine. Grade 1 listhesis at L4-L5, degenerative related to facet arthropathy. 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 There is a right-sided synovial herniation pit at the femoral head neck junction. There are no suspicious osseous lesions. There is extensive subcutaneous soft tissue swelling along the lower back. There is associated skin thickening. There is no focal fluid collection. There is no soft tissue gas. IMPRESSION: Extensive skin thickening and subcutaneous soft tissue swelling along the lower back, as can be seen in cellulitis. No soft tissue gas or evidence of soft tissue abscess. Enlarged left inguinal and external iliac lymph nodes, could be reactive. Recommend follow-up ultrasound in 3 months to ensure resolution. Electronically Signed   By: JMaurine SimmeringM.D.   On: 11/04/2021 18:03    EKG: Independently reviewed. See above   Assessment/Plan  Gluteal Region Cellulitis with area of central necrosis asscociated with sepsis -temp,increase wbc, lactic acid elevation  - admit to progressive care  - continue on vanc and zosyn  -surgery consulted for further evaluation re ? Need for debridement  ( Dr PHampton Abbotwill see in am )  - supportive care with pain medications, ivfs -trend lactic acid, and  inflammatory markers  - mrsa pcr pending , f/u on blood     DMII -note compliant with  lantus in over the last 2 weeks -elevated fs in 250's  - A1c  - place on is/fs  -hold oral hypoglycemic for now -resume lantus   Chronic bronchitis  -no current respiratory symptoms  -supportive care prn nebs   HTN - resume home regimen once med rec completed as able   DVT prophylaxis: heparin  Code Status: full Family Communication:none at bedside Disposition Plan: patient  expected to be admitted greater than 2 midnights  Consults called:surgery [place] Admission status: progressive care   SClance BollMD [facility]   If 7PM-7AM, please contact night-coverage [email or web address]  Password [email or web address]  11/04/2021, 6:54 PM

## 2021-11-04 NOTE — Progress Notes (Signed)
11/04/21  Case discussed with Dr. Tamala Julian.  Patient presented with 2-week history of pain in her lower back.  Has an area that she reports has been itching and thinks it was draining.  Dr. Tamala Julian took picture of the wound and also obtained CT scan.  CT shows extensive skin thickening and soft tissue swelling but no evidence of abscess.  Her blood glucose is elevated to 253, likely uncontrolled diabetes.  WBC 20.6.  lactic acid mildly elevated to 2.1.  Recommend admission to medical team and start her on IV abx.  Likely would need debridement but will evaluate her in AM to see how she's responded overnight.  Please make NPO after midnight as a precaution.  Olean Ree, MD

## 2021-11-04 NOTE — ED Provider Notes (Signed)
[facility] Provider Note    Event Date/Time   First MD Initiated Contact with Patient 11/04/21 1507     (approximate)   History   Abscess   HPI  Jamie Rodriguez is a 61 y.o. female with past medical history of HTN, DM and tobacco abuse as well as previous skin abscesses who presents to emergency room after being referred from clinic for further evaluation of an infection in her lower back.  Patient states it started hurting about 2 weeks ago.  She endorses fevers, chills, intermittent headaches and is not sure if it has been bleeding or draining but thinks it has.  She states she has been itching a little bit.  Does not recall any injuries to the area.  No recent chest pain, shortness of breath    Past Medical History:  Diagnosis Date   Bronchitis    chronic   Diabetes mellitus without complication ([place])    Hypertension      Physical Exam  Triage Vital Signs: ED Triage Vitals [11/04/21 1505]  Enc Vitals Group     BP 124/67     Pulse Rate (!) 101     Resp 18     Temp (!) 100.9 F (38.3 C)     Temp Source Oral     SpO2 96 %     Weight      Height      Head Circumference      Peak Flow      Pain Score      Pain Loc      Pain Edu?      Excl. in [place]?     Most recent vital signs: Vitals:   11/04/21 1505 11/04/21 1630  BP: 124/67 122/74  Pulse: (!) 101 (!) 101  Resp: 18 18  Temp: (!) 100.9 F (38.3 C)   SpO2: 96% 100%    General: Awake, seems fairly uncomfortable.  Slightly tachycardic. CV:  Good peripheral perfusion.  2+ radial pulse.  Tachycardic. Resp:  Normal effort.  Abd:  No distention.  Soft. Other:  Ulcerated purulent lesion just superior to the gluteal cleft      ED Results / Procedures / Treatments  Labs (all labs ordered are listed, but only abnormal results are displayed) Labs Reviewed  LACTIC ACID, PLASMA - Abnormal; Notable for the following components:      Result Value   Lactic Acid, Venous 2.1 (*)    All  other components within normal limits  COMPREHENSIVE METABOLIC PANEL - Abnormal; Notable for the following components:   Sodium 132 (*)    Glucose, Bld 253 (*)    Albumin 3.2 (*)    All other components within normal limits  CBC WITH DIFFERENTIAL/PLATELET - Abnormal; Notable for the following components:   WBC 20.6 (*)    Neutro Abs 15.6 (*)    Monocytes Absolute 2.2 (*)    Abs Immature Granulocytes 0.10 (*)    All other components within normal limits  PROTIME-INR - Abnormal; Notable for the following components:   Prothrombin Time 15.3 (*)    All other components within normal limits  URINALYSIS, COMPLETE (UACMP) WITH MICROSCOPIC - Abnormal; Notable for the following components:   Color, Urine AMBER (*)    APPearance HAZY (*)    Glucose, UA >=500 (*)    Ketones, ur 5 (*)    Protein, ur 30 (*)    Bacteria, UA RARE (*)    All other components within  normal limits  CULTURE, BLOOD (ROUTINE X 2)  CULTURE, BLOOD (ROUTINE X 2)  AEROBIC/ANAEROBIC CULTURE W GRAM STAIN (SURGICAL/DEEP WOUND)  APTT  HEMOGLOBIN A1C  LACTIC ACID, PLASMA     EKG   EKG is remarkable for sinus rhythm with a ventricular rate of 98, normal axis, unremarkable intervals without evidence of acute ischemia or significant arrhythmia.   RADIOLOGY . CT abdomen pelvis on my interpretation without clear focal discrete abscess on patient's back there is surrounding inflammation.  I reviewed radiology interpretation and agree with the findings of same without evidence of soft tissue abscess or gas.   PROCEDURES:  Critical Care performed: Yes, see critical care procedure note(s)  .Critical Care  Performed by: Lucrezia Starch, MD Authorized by: Lucrezia Starch, MD   Critical care provider statement:    Critical care time (minutes):  30   Critical care was necessary to treat or prevent imminent or life-threatening deterioration of the following conditions:  Sepsis   Critical care was time spent personally by  me on the following activities:  Development of treatment plan with patient or surrogate, discussions with consultants, evaluation of patient's response to treatment, examination of patient, ordering and review of laboratory studies, ordering and review of radiographic studies, ordering and performing treatments and interventions, pulse oximetry, re-evaluation of patient's condition and review of old charts    MEDICATIONS ORDERED IN ED: Medications  vancomycin (VANCOREADY) IVPB 2000 mg/400 mL (has no administration in time range)  insulin aspart (novoLOG) injection 0-15 Units (has no administration in time range)  lactated ringers bolus 1,000 mL (has no administration in time range)  ketorolac (TORADOL) 30 MG/ML injection 15 mg (has no administration in time range)  lactated ringers bolus 1,000 mL (1,000 mLs Intravenous New Bag/Given 11/04/21 1633)  acetaminophen (OFIRMEV) IV 1,000 mg (0 mg Intravenous Stopped 11/04/21 1654)  ceFEPIme (MAXIPIME) 2 g in sodium chloride 0.9 % 100 mL IVPB (0 g Intravenous Stopped 11/04/21 1725)  iohexol (OMNIPAQUE) 300 MG/ML solution 100 mL (100 mLs Intravenous Contrast Given 11/04/21 1716)     IMPRESSION / MDM / [place] / ED COURSE  I reviewed the triage vital signs and the nursing notes. Patient's presentation is most consistent with acute presentation with potential threat to life or bodily function.                               Differential diagnosis includes, but is not limited to, possible sepsis secondary to an abscess with what appears to be some significant surrounding cellulitis.  Given patient is septic and the size of the lesion which appears at least 3 cm in diameter with surrounding erythema induration will obtain a CT to see how deep this goes.  EKG is remarkable for sinus rhythm with a ventricular rate of 98, normal axis, unremarkable intervals without evidence of acute ischemia or significant arrhythmia.  CMP is remarkable for  glucose of 253 without any other significant electrolyte or metabolic derangements.  CBC shows WBC count 20.6 with normal hemoglobin and platelets.  Initial lactic acid elevated 2.1.  INR 1.3 PTT 31.  UA has some ketones protein rare bacteria as well as gram 500 glucose.  Blood and wound culture sent.  CT abdomen pelvis on my interpretation without clear focal discrete abscess on patient's back there is surrounding inflammation.  I reviewed radiology interpretation and agree with the findings of same without evidence of soft tissue abscess  or gas.  Given concern for sepsis patient started on IV fluids and broad-spectrum antibiotics.  She is also given some sliding scale insulin IV Tylenol and Toradol.  Given concern for significant cellulitis with some what appears to be dead tissue I reached out to on-call general surgeon and spoke with Dr. Hampton Abbot who recommended hospitalist admission IV antibiotics with plan to see the patient tomorrow for possible debridement.  I updated patient.  Admit to medicine service.       FINAL CLINICAL IMPRESSION(S) / ED DIAGNOSES   Final diagnoses:  Sepsis, due to unspecified organism, unspecified whether acute organ dysfunction present ([place])  Cellulitis of buttock  Hyperglycemia  Tobacco abuse     Rx / DC Orders   ED Discharge Orders     None        Note:  This document was prepared using Dragon voice recognition software and may include unintentional dictation errors.   Lucrezia Starch, MD 11/04/21 [phone]

## 2021-11-05 ENCOUNTER — Other Ambulatory Visit: Payer: Self-pay

## 2021-11-05 ENCOUNTER — Inpatient Hospital Stay: Payer: Medicare Other | Admitting: Anesthesiology

## 2021-11-05 ENCOUNTER — Encounter
Admission: EM | Disposition: A | Discharge status: Home Health | Payer: Self-pay | Source: Home / Self Care | Attending: Internal Medicine

## 2021-11-05 ENCOUNTER — Encounter: Payer: Self-pay | Admitting: Internal Medicine

## 2021-11-05 DIAGNOSIS — J42 Unspecified chronic bronchitis: Secondary | ICD-10-CM | POA: Diagnosis present

## 2021-11-05 DIAGNOSIS — S31000A Unspecified open wound of lower back and pelvis without penetration into retroperitoneum, initial encounter: Secondary | ICD-10-CM | POA: Insufficient documentation

## 2021-11-05 DIAGNOSIS — A419 Sepsis, unspecified organism: Secondary | ICD-10-CM | POA: Diagnosis present

## 2021-11-05 DIAGNOSIS — L89153 Pressure ulcer of sacral region, stage 3: Secondary | ICD-10-CM | POA: Diagnosis not present

## 2021-11-05 DIAGNOSIS — E669 Obesity, unspecified: Secondary | ICD-10-CM | POA: Diagnosis present

## 2021-11-05 DIAGNOSIS — L03317 Cellulitis of buttock: Secondary | ICD-10-CM | POA: Diagnosis not present

## 2021-11-05 DIAGNOSIS — E119 Type 2 diabetes mellitus without complications: Secondary | ICD-10-CM

## 2021-11-05 DIAGNOSIS — I10 Essential (primary) hypertension: Secondary | ICD-10-CM | POA: Diagnosis present

## 2021-11-05 HISTORY — PX: DEBRIDMENT OF DECUBITUS ULCER: SHX6276

## 2021-11-05 LAB — CBG MONITORING, ED
Glucose-Capillary: 216 mg/dL — ABNORMAL HIGH (ref 70–99)
Glucose-Capillary: 165 mg/dL — ABNORMAL HIGH (ref 70–99)
Glucose-Capillary: 175 mg/dL — ABNORMAL HIGH (ref 70–99)
Glucose-Capillary: 137 mg/dL — ABNORMAL HIGH (ref 70–99)

## 2021-11-05 LAB — COMPREHENSIVE METABOLIC PANEL
ALT: 29 U/L (ref 0–44)
AST: 32 U/L (ref 15–41)
Albumin: 2.7 g/dL — ABNORMAL LOW (ref 3.5–5.0)
Alkaline Phosphatase: 81 U/L (ref 38–126)
Anion gap: 6 (ref 5–15)
BUN: 13 mg/dL (ref 8–23)
CO2: 23 mmol/L (ref 22–32)
Calcium: 8.6 mg/dL — ABNORMAL LOW (ref 8.9–10.3)
Chloride: 107 mmol/L (ref 98–111)
Creatinine, Ser: 0.71 mg/dL (ref 0.44–1.00)
GFR, Estimated: >60 mL/min (ref >60)
Glucose, Bld: 176 mg/dL — ABNORMAL HIGH (ref 70–99)
Potassium: 3.5 mmol/L (ref 3.5–5.1)
Sodium: 136 mmol/L (ref 135–145)
Total Bilirubin: 0.6 mg/dL (ref 0.3–1.2)
Total Protein: 7.1 g/dL (ref 6.5–8.1)

## 2021-11-05 LAB — C-REACTIVE PROTEIN: CRP: 22.6 mg/dL — ABNORMAL HIGH (ref <1.0)

## 2021-11-05 LAB — PROTIME-INR
INR: 1.3 — ABNORMAL HIGH (ref 0.8–1.2)
Prothrombin Time: 16.3 seconds — ABNORMAL HIGH (ref 11.4–15.2)

## 2021-11-05 LAB — CBC
HCT: 37.7 % (ref 36.0–46.0)
Hemoglobin: 12.3 g/dL (ref 12.0–15.0)
MCH: 28.5 pg (ref 26.0–34.0)
MCHC: 32.6 g/dL (ref 30.0–36.0)
MCV: 87.3 fL (ref 80.0–100.0)
Platelets: 293 10*3/uL (ref 150–400)
RBC: 4.32 MIL/uL (ref 3.87–5.11)
RDW: 12.3 % (ref 11.5–15.5)
WBC: 17.9 10*3/uL — ABNORMAL HIGH (ref 4.0–10.5)
nRBC: 0 % (ref 0.0–0.2)

## 2021-11-05 LAB — GLUCOSE, CAPILLARY
Glucose-Capillary: 164 mg/dL — ABNORMAL HIGH (ref 70–99)
Glucose-Capillary: 271 mg/dL — ABNORMAL HIGH (ref 70–99)
Glucose-Capillary: 364 mg/dL — ABNORMAL HIGH (ref 70–99)

## 2021-11-05 LAB — HIV ANTIBODY (ROUTINE TESTING W REFLEX): HIV Screen 4th Generation wRfx: NONREACTIVE

## 2021-11-05 LAB — MRSA NEXT GEN BY PCR, NASAL: MRSA by PCR Next Gen: NOT DETECTED

## 2021-11-05 SURGERY — DEBRIDMENT OF DECUBITUS ULCER
Anesthesia: General

## 2021-11-05 MED ORDER — BUPIVACAINE LIPOSOME 1.3 % IJ SUSP
INTRAMUSCULAR | Status: AC
  Filled 2021-11-05: qty 20

## 2021-11-05 MED ORDER — 0.9 % SODIUM CHLORIDE (POUR BTL) OPTIME
TOPICAL | Status: DC | PRN
  Administered 2021-11-05: 500 mL

## 2021-11-05 MED ORDER — DEXAMETHASONE SODIUM PHOSPHATE 10 MG/ML IJ SOLN
INTRAMUSCULAR | Status: DC | PRN
  Administered 2021-11-05: 4 mg via INTRAVENOUS

## 2021-11-05 MED ORDER — ACETAMINOPHEN 500 MG PO TABS
1000.0000 mg | ORAL_TABLET | Freq: Four times a day (QID) | ORAL | Status: DC | PRN
  Administered 2021-11-06: 1000 mg via ORAL

## 2021-11-05 MED ORDER — SODIUM CHLORIDE 0.9 % IV SOLN: INTRAVENOUS | Status: DC | PRN

## 2021-11-05 MED ORDER — INSULIN GLARGINE-YFGN 100 UNIT/ML ~~LOC~~ SOLN
15.0000 [IU] | Freq: Every day | SUBCUTANEOUS | Status: DC
  Administered 2021-11-05: 15 [IU] via SUBCUTANEOUS
  Filled 2021-11-05 (×2): qty 0.15

## 2021-11-05 MED ORDER — ROCURONIUM BROMIDE 100 MG/10ML IV SOLN
INTRAVENOUS | Status: DC | PRN
  Administered 2021-11-05: 50 mg via INTRAVENOUS

## 2021-11-05 MED ORDER — ACETAMINOPHEN 10 MG/ML IV SOLN
INTRAVENOUS | Status: DC | PRN
  Administered 2021-11-05: 1000 mg via INTRAVENOUS

## 2021-11-05 MED ORDER — LIDOCAINE HCL (CARDIAC) PF 100 MG/5ML IV SOSY
PREFILLED_SYRINGE | INTRAVENOUS | Status: DC | PRN
  Administered 2021-11-05: 60 mg via INTRAVENOUS

## 2021-11-05 MED ORDER — MORPHINE SULFATE (PF) 2 MG/ML IV SOLN
2.0000 mg | INTRAVENOUS | Status: DC | PRN
  Administered 2021-11-05: 2 mg via INTRAVENOUS
  Filled 2021-11-05: qty 1

## 2021-11-05 MED ORDER — KETOROLAC TROMETHAMINE 30 MG/ML IJ SOLN
30.0000 mg | Freq: Four times a day (QID) | INTRAMUSCULAR | Status: DC
  Administered 2021-11-05 – 2021-11-08 (×12): 30 mg via INTRAVENOUS
  Filled 2021-11-05 (×13): qty 1

## 2021-11-05 MED ORDER — OXYCODONE HCL 5 MG PO TABS: 5.0000 mg | ORAL_TABLET | Freq: Once | ORAL | Status: DC | PRN

## 2021-11-05 MED ORDER — SODIUM CHLORIDE 0.9 % IV SOLN: INTRAVENOUS | Status: AC

## 2021-11-05 MED ORDER — MIDAZOLAM HCL 2 MG/2ML IJ SOLN
INTRAMUSCULAR | Status: AC
  Filled 2021-11-05: qty 2

## 2021-11-05 MED ORDER — BUPIVACAINE-EPINEPHRINE (PF) 0.5% -1:200000 IJ SOLN
INTRAMUSCULAR | Status: DC | PRN
  Administered 2021-11-05: 50 mL

## 2021-11-05 MED ORDER — FENTANYL CITRATE (PF) 100 MCG/2ML IJ SOLN
INTRAMUSCULAR | Status: DC | PRN
  Administered 2021-11-05 (×4): 50 ug via INTRAVENOUS

## 2021-11-05 MED ORDER — FENTANYL CITRATE (PF) 100 MCG/2ML IJ SOLN
INTRAMUSCULAR | Status: AC
  Filled 2021-11-05: qty 2

## 2021-11-05 MED ORDER — ACETAMINOPHEN 10 MG/ML IV SOLN
INTRAVENOUS | Status: AC
  Filled 2021-11-05: qty 100

## 2021-11-05 MED ORDER — DEXMEDETOMIDINE (PRECEDEX) IN NS 20 MCG/5ML (4 MCG/ML) IV SYRINGE
PREFILLED_SYRINGE | INTRAVENOUS | Status: DC | PRN
  Administered 2021-11-05: 12 ug via INTRAVENOUS
  Administered 2021-11-05: 8 ug via INTRAVENOUS

## 2021-11-05 MED ORDER — BUPIVACAINE-EPINEPHRINE (PF) 0.5% -1:200000 IJ SOLN
INTRAMUSCULAR | Status: AC
  Filled 2021-11-05: qty 30

## 2021-11-05 MED ORDER — SUGAMMADEX SODIUM 200 MG/2ML IV SOLN
INTRAVENOUS | Status: DC | PRN
  Administered 2021-11-05: 200 mg via INTRAVENOUS

## 2021-11-05 MED ORDER — MIDAZOLAM HCL 2 MG/2ML IJ SOLN
INTRAMUSCULAR | Status: DC | PRN
  Administered 2021-11-05: 2 mg via INTRAVENOUS

## 2021-11-05 MED ORDER — FENTANYL CITRATE (PF) 100 MCG/2ML IJ SOLN: 25.0000 ug | INTRAMUSCULAR | Status: DC | PRN

## 2021-11-05 MED ORDER — FENTANYL CITRATE PF 50 MCG/ML IJ SOSY
PREFILLED_SYRINGE | INTRAMUSCULAR | Status: AC
  Filled 2021-11-05: qty 1

## 2021-11-05 MED ORDER — PROPOFOL 10 MG/ML IV BOLUS
INTRAVENOUS | Status: DC | PRN
  Administered 2021-11-05: 150 mg via INTRAVENOUS

## 2021-11-05 MED ORDER — OXYCODONE HCL 5 MG/5ML PO SOLN: 5.0000 mg | Freq: Once | ORAL | Status: DC | PRN

## 2021-11-05 MED ORDER — ONDANSETRON HCL 4 MG/2ML IJ SOLN
INTRAMUSCULAR | Status: DC | PRN
  Administered 2021-11-05: 4 mg via INTRAVENOUS

## 2021-11-05 MED ORDER — FENTANYL CITRATE PF 50 MCG/ML IJ SOSY
50.0000 ug | PREFILLED_SYRINGE | Freq: Once | INTRAMUSCULAR | Status: AC
  Administered 2021-11-05: 50 ug via INTRAVENOUS

## 2021-11-05 SURGICAL SUPPLY — 35 items
BNDG GAUZE DERMACEA FLUFF (GAUZE/BANDAGES/DRESSINGS) ×1
BNDG GAUZE DERMACEA FLUFF 4 (GAUZE/BANDAGES/DRESSINGS) ×2 IMPLANT
BNDG GZE DERMACEA 4 6PLY (GAUZE/BANDAGES/DRESSINGS) ×1
CNTNR SPEC 2.5X3XGRAD LEK (MISCELLANEOUS) ×1
CONT SPEC 4OZ STER OR WHT (MISCELLANEOUS) ×1
CONT SPEC 4OZ STRL OR WHT (MISCELLANEOUS) ×1
CONTAINER SPEC 2.5X3XGRAD LEK (MISCELLANEOUS) ×2 IMPLANT
DRAIN PENROSE 12X.25 LTX STRL (MISCELLANEOUS) ×3 IMPLANT
DRAPE LAPAROTOMY 77X122 PED (DRAPES) ×3 IMPLANT
DRSG GAUZE FLUFF 36X18 (GAUZE/BANDAGES/DRESSINGS) ×3 IMPLANT
DRSG VAC ATS LRG SENSATRAC (GAUZE/BANDAGES/DRESSINGS) ×1 IMPLANT
DRSG VAC ATS MED SENSATRAC (GAUZE/BANDAGES/DRESSINGS) ×1 IMPLANT
ELECT CAUTERY BLADE TIP 2.5 (TIP) ×2
ELECT REM PT RETURN 9FT ADLT (ELECTROSURGICAL) ×2
ELECTRODE CAUTERY BLDE TIP 2.5 (TIP) ×2 IMPLANT
ELECTRODE REM PT RTRN 9FT ADLT (ELECTROSURGICAL) ×2 IMPLANT
GAUZE 4X4 16PLY ~~LOC~~+RFID DBL (SPONGE) ×3 IMPLANT
GAUZE SPONGE 4X4 12PLY STRL (GAUZE/BANDAGES/DRESSINGS) ×3 IMPLANT
GLOVE SURG SYN 7.0 (GLOVE) ×2 IMPLANT
GLOVE SURG SYN 7.0 PF PI (GLOVE) ×2 IMPLANT
GLOVE SURG SYN 7.5  E (GLOVE) ×2
GLOVE SURG SYN 7.5 E (GLOVE) ×1 IMPLANT
GLOVE SURG SYN 7.5 PF PI (GLOVE) ×2 IMPLANT
GOWN STRL REUS W/ TWL LRG LVL3 (GOWN DISPOSABLE) ×4 IMPLANT
GOWN STRL REUS W/TWL LRG LVL3 (GOWN DISPOSABLE) ×4
KIT TURNOVER KIT A (KITS) ×3 IMPLANT
MANIFOLD NEPTUNE II (INSTRUMENTS) ×3 IMPLANT
NEEDLE HYPO 22GX1.5 SAFETY (NEEDLE) ×3 IMPLANT
NS IRRIG 500ML POUR BTL (IV SOLUTION) ×3 IMPLANT
PACK BASIN MINOR ARMC (MISCELLANEOUS) ×3 IMPLANT
SOL PREP PVP 2OZ (MISCELLANEOUS) ×2
SOLUTION PREP PVP 2OZ (MISCELLANEOUS) ×2 IMPLANT
SPONGE T-LAP 18X18 ~~LOC~~+RFID (SPONGE) ×6 IMPLANT
SWAB CULTURE AMIES ANAERIB BLU (MISCELLANEOUS) ×5 IMPLANT
SYR 20ML LL LF (SYRINGE) ×3 IMPLANT

## 2021-11-05 NOTE — Consult Note (Signed)
Date of Consultation:  11/05/2021  Requesting Physician:  Hulan Saas, MD  Reason for Consultation:  Sacral wound  History of Present Illness: Jamie Rodriguez is a 61 y.o. female presenting to the ED yesterday for worsening wound in her lower back.  The patient reports that she's had this probably for about a month.  She reports it started very small and has been worsening.  She reports this has been itching and has been tender.  She thinks it's been draining as well, but she's been unable to fully see the wound at home.  She lives alone.  She also reports feeling feverish at home and feeling worse particularly over the past two weeks.  She also reports not being compliant with her diabetes medications.  Her blood glucose in the ED was 253.  Overall, workup showed a WBC of 20.6, lactic acid of 2.1 which resolved to 1.2 after fluids.  CT scan was also done showing extensive soft tissue stranding and skin thickening, but no abscess.  Dr. Tamala Julian also took a picture of the wound which is in her chart.  Past Medical History: Past Medical History:  Diagnosis Date   Bronchitis    chronic   Diabetes mellitus without complication ([place])    Hypertension      Past Surgical History: Past Surgical History:  Procedure Laterality Date   CARPAL TUNNEL RELEASE Right    CHOLECYSTECTOMY     TONSILLECTOMY     age 35 or 14    Home Medications: Prior to Admission medications   Medication Sig Start Date End Date Taking? Authorizing Provider  atorvastatin (LIPITOR) 10 MG tablet Take 10 mg by mouth daily. 09/07/21  Yes [provider]  hydrochlorothiazide (HYDRODIURIL) 25 MG tablet Take 25 mg by mouth daily. 12/16/19  Yes [provider]  LANTUS SOLOSTAR 100 UNIT/ML Solostar Pen Inject 20 Units into the skin at bedtime. 09/07/21  Yes [provider]  liraglutide (VICTOZA) 18 MG/3ML SOPN Inject 1.2 mg into the skin once a week.    Yes [provider]  meloxicam (MOBIC) 15 MG  tablet Take 15 mg by mouth daily. 12/16/19  Yes [provider]  metFORMIN (GLUCOPHAGE) 1000 MG tablet Take 1,000 mg by mouth 2 (two) times daily. 12/16/19  Yes [provider]  aspirin EC 81 MG tablet Take 81 mg by mouth 4 (four) times a week. Swallow whole. Patient not taking: Reported on 11/04/2021    [provider]  citalopram (CELEXA) 10 MG tablet Take 10 mg by mouth daily.  Patient not taking: Reported on 11/04/2021 12/16/19   [provider]  glipiZIDE (GLUCOTROL XL) 10 MG 24 hr tablet Take 20 mg by mouth daily. Patient not taking: Reported on 11/04/2021 12/16/19   [provider]    Allergies: Allergies  Allergen Reactions   Penicillins Rash    Social History:  reports that she has been smoking cigarettes. She has a 15.00 pack-year smoking history. She has never used smokeless tobacco. She reports that she does not currently use alcohol. She reports that she does not use drugs.   Family History: Family History  Problem Relation Age of Onset   Breast cancer Sister 48    Review of Systems: Review of Systems  Constitutional:  Positive for fever. Negative for chills.  Respiratory:  Negative for shortness of breath.   Cardiovascular:  Negative for chest pain.  Gastrointestinal:  Negative for abdominal pain, nausea and vomiting.  Genitourinary:  Negative for dysuria.  Musculoskeletal:  Positive for back pain (lower back pain at the wound). Negative for myalgias.  Skin:  Positive for itching.       Sacral wound  Neurological:  Negative for dizziness.  Psychiatric/Behavioral:  Negative for depression.     Physical Exam BP 109/71   Pulse (!) 106   Temp 98.5 F (36.9 C) (Oral)   Resp 16   Ht '5\' 5"'$  (1.651 m)   Wt 91.6 kg   SpO2 99%   BMI 33.61 kg/m  CONSTITUTIONAL: No acute distress HEENT:  Normocephalic, atraumatic, extraocular motion intact. NECK: Trachea is midline, and there is no jugular venous distension. RESPIRATORY:   Normal respiratory effort without pathologic use of accessory muscles. CARDIOVASCULAR: Regular rhythm and rate. MUSCULOSKELETAL:  Normal muscle strength and tone in all four extremities.  No peripheral edema or cyanosis. SKIN: The patient has a sacral wound that is about 5-6 cm in diameter, consistent with ulcerated and necrotic skin.  There is surrounding induration extending to bilateral upper buttocks about 10 cm on either side.  No active purulent drainage.  The patient is tender to palpation in this entire area.  NEUROLOGIC:  Motor and sensation is grossly normal.  Cranial nerves are grossly intact. PSYCH:  Alert and oriented to person, place and time. Affect is normal.  Laboratory Analysis: Results for orders placed or performed during the hospital encounter of 11/04/21 (from the past 24 hour(s))  Lactic acid, plasma     Status: Abnormal   Collection Time: 11/04/21  4:15 PM  Result Value Ref Range   Lactic Acid, Venous 2.1 (HH) 0.5 - 1.9 mmol/L  Comprehensive metabolic panel     Status: Abnormal   Collection Time: 11/04/21  4:15 PM  Result Value Ref Range   Sodium 132 (L) 135 - 145 mmol/L   Potassium 3.8 3.5 - 5.1 mmol/L   Chloride 98 98 - 111 mmol/L   CO2 23 22 - 32 mmol/L   Glucose, Bld 253 (H) 70 - 99 mg/dL   BUN 12 8 - 23 mg/dL   Creatinine, Ser 0.81 0.44 - 1.00 mg/dL   Calcium 9.2 8.9 - 10.3 mg/dL   Total Protein 7.9 6.5 - 8.1 g/dL   Albumin 3.2 (L) 3.5 - 5.0 g/dL   AST 31 15 - 41 U/L   ALT 26 0 - 44 U/L   Alkaline Phosphatase 86 38 - 126 U/L   Total Bilirubin 0.6 0.3 - 1.2 mg/dL   GFR, Estimated >60 >60 mL/min   Anion gap 11 5 - 15  CBC with Differential     Status: Abnormal   Collection Time: 11/04/21  4:15 PM  Result Value Ref Range   WBC 20.6 (H) 4.0 - 10.5 K/uL   RBC 4.50 3.87 - 5.11 MIL/uL   Hemoglobin 12.8 12.0 - 15.0 g/dL   HCT 39.8 36.0 - 46.0 %   MCV 88.4 80.0 - 100.0 fL   MCH 28.4 26.0 - 34.0 pg   MCHC 32.2 30.0 - 36.0 g/dL   RDW 12.3 11.5 - 15.5 %    Platelets 304 150 - 400 K/uL   nRBC 0.0 0.0 - 0.2 %   Neutrophils Relative % 75 %   Neutro Abs 15.6 (H) 1.7 - 7.7 K/uL   Lymphocytes Relative 13 %   Lymphs Abs 2.6 0.7 - 4.0 K/uL   Monocytes Relative 11 %   Monocytes Absolute 2.2 (H) 0.1 - 1.0 K/uL   Eosinophils Relative 0 %   Eosinophils Absolute 0.1  0.0 - 0.5 K/uL   Basophils Relative 0 %   Basophils Absolute 0.1 0.0 - 0.1 K/uL   Immature Granulocytes 1 %   Abs Immature Granulocytes 0.10 (H) 0.00 - 0.07 K/uL  Protime-INR     Status: Abnormal   Collection Time: 11/04/21  4:15 PM  Result Value Ref Range   Prothrombin Time 15.3 (H) 11.4 - 15.2 seconds   INR 1.2 0.8 - 1.2  APTT     Status: None   Collection Time: 11/04/21  4:15 PM  Result Value Ref Range   aPTT 31 24 - 36 seconds  Blood Culture (routine x 2)     Status: None (Preliminary result)   Collection Time: 11/04/21  4:15 PM   Specimen: BLOOD RIGHT HAND  Result Value Ref Range   Specimen Description BLOOD RIGHT HAND    Special Requests      BOTTLES DRAWN AEROBIC AND ANAEROBIC Blood Culture results may not be optimal due to an inadequate volume of blood received in culture bottles   Culture      NO GROWTH < 24 HOURS Performed at [facility], [address] [place], [place] [address]    Report Status PENDING   Blood Culture (routine x 2)     Status: None (Preliminary result)   Collection Time: 11/04/21  4:15 PM   Specimen: BLOOD LEFT FOREARM  Result Value Ref Range   Specimen Description BLOOD LEFT FOREARM    Special Requests      BOTTLES DRAWN AEROBIC AND ANAEROBIC Blood Culture results may not be optimal due to an inadequate volume of blood received in culture bottles   Culture      NO GROWTH < 24 HOURS Performed at [facility], [address] [place], [place] [address]    Report Status PENDING   Urinalysis, Complete w Microscopic     Status: Abnormal   Collection Time: 11/04/21  4:15 PM  Result Value Ref Range   Color, Urine AMBER (A)  YELLOW   APPearance HAZY (A) CLEAR   Specific Gravity, Urine 1.022 1.005 - 1.030   pH 5.0 5.0 - 8.0   Glucose, UA >=500 (A) NEGATIVE mg/dL   Hgb urine dipstick NEGATIVE NEGATIVE   Bilirubin Urine NEGATIVE NEGATIVE   Ketones, ur 5 (A) NEGATIVE mg/dL   Protein, ur 30 (A) NEGATIVE mg/dL   Nitrite NEGATIVE NEGATIVE   Leukocytes,Ua NEGATIVE NEGATIVE   RBC / HPF 0-5 0 - 5 RBC/hpf   WBC, UA 6-10 0 - 5 WBC/hpf   Bacteria, UA RARE (A) NONE SEEN   Squamous Epithelial / LPF 0-5 0 - 5   Mucus PRESENT    Hyaline Casts, UA PRESENT   Aerobic/Anaerobic Culture w Gram Stain (surgical/deep wound)     Status: None (Preliminary result)   Collection Time: 11/04/21  4:15 PM   Specimen: Wound; Abscess  Result Value Ref Range   Specimen Description      WOUND Performed at [facility], [address] [place], [place] [address]    Special Requests      NONE Performed at [facility], [place], [place] [address]    Gram Stain      NO SQUAMOUS EPITHELIAL CELLS SEEN MODERATE WBC SEEN MODERATE GRAM POSITIVE COCCI Performed at [facility], [place] [address] [place], [place] [address]    Culture PENDING    Report Status PENDING   Hemoglobin A1c     Status: Abnormal  Collection Time: 11/04/21  4:15 PM  Result Value Ref Range   Hgb A1c MFr Bld 12.6 (H) 4.8 - 5.6 %   Mean Plasma Glucose 314.92 mg/dL  TSH     Status: None   Collection Time: 11/04/21  4:15 PM  Result Value Ref Range   TSH 1.278 0.350 - 4.500 uIU/mL  Procalcitonin - Baseline     Status: None   Collection Time: 11/04/21  4:15 PM  Result Value Ref Range   Procalcitonin 0.16 ng/mL  Lactic acid, plasma     Status: None   Collection Time: 11/04/21  9:46 PM  Result Value Ref Range   Lactic Acid, Venous 1.2 0.5 - 1.9 mmol/L  CBG monitoring, ED     Status: Abnormal   Collection Time: 11/04/21  9:52 PM  Result Value Ref Range   Glucose-Capillary 124 (H) 70 - 99 mg/dL  CBG monitoring, ED      Status: Abnormal   Collection Time: 11/05/21  1:58 AM  Result Value Ref Range   Glucose-Capillary 216 (H) 70 - 99 mg/dL  MRSA Next Gen by PCR, Nasal     Status: None   Collection Time: 11/05/21  4:08 AM   Specimen: Nasal Mucosa; Nasal Swab  Result Value Ref Range   MRSA by PCR Next Gen NOT DETECTED NOT DETECTED  Comprehensive metabolic panel     Status: Abnormal   Collection Time: 11/05/21  6:29 AM  Result Value Ref Range   Sodium 136 135 - 145 mmol/L   Potassium 3.5 3.5 - 5.1 mmol/L   Chloride 107 98 - 111 mmol/L   CO2 23 22 - 32 mmol/L   Glucose, Bld 176 (H) 70 - 99 mg/dL   BUN 13 8 - 23 mg/dL   Creatinine, Ser 0.71 0.44 - 1.00 mg/dL   Calcium 8.6 (L) 8.9 - 10.3 mg/dL   Total Protein 7.1 6.5 - 8.1 g/dL   Albumin 2.7 (L) 3.5 - 5.0 g/dL   AST 32 15 - 41 U/L   ALT 29 0 - 44 U/L   Alkaline Phosphatase 81 38 - 126 U/L   Total Bilirubin 0.6 0.3 - 1.2 mg/dL   GFR, Estimated >60 >60 mL/min   Anion gap 6 5 - 15  CBC     Status: Abnormal   Collection Time: 11/05/21  6:29 AM  Result Value Ref Range   WBC 17.9 (H) 4.0 - 10.5 K/uL   RBC 4.32 3.87 - 5.11 MIL/uL   Hemoglobin 12.3 12.0 - 15.0 g/dL   HCT 37.7 36.0 - 46.0 %   MCV 87.3 80.0 - 100.0 fL   MCH 28.5 26.0 - 34.0 pg   MCHC 32.6 30.0 - 36.0 g/dL   RDW 12.3 11.5 - 15.5 %   Platelets 293 150 - 400 K/uL   nRBC 0.0 0.0 - 0.2 %  Protime-INR     Status: Abnormal   Collection Time: 11/05/21  6:29 AM  Result Value Ref Range   Prothrombin Time 16.3 (H) 11.4 - 15.2 seconds   INR 1.3 (H) 0.8 - 1.2    Imaging: CT ABDOMEN PELVIS W CONTRAST  Result Date: 11/04/2021 CLINICAL DATA:  Sepsis back abscess EXAM: CT ABDOMEN AND PELVIS WITH CONTRAST TECHNIQUE: Multidetector CT imaging of the abdomen and pelvis was performed using the standard protocol following bolus administration of intravenous contrast. RADIATION DOSE REDUCTION: This exam was performed according to the departmental dose-optimization program which includes automated exposure  control, adjustment of the mA and/or kV  according to patient size and/or use of iterative reconstruction technique. CONTRAST:  144m OMNIPAQUE IOHEXOL 300 MG/ML  SOLN COMPARISON:  None Available. FINDINGS: Lower chest: No acute abnormality. Hepatobiliary: No focal liver abnormality is seen. Prior cholecystectomy. Pancreas: Unremarkable. No pancreatic ductal dilatation or surrounding inflammatory changes. Spleen: Normal in size without focal abnormality. Unchanged adjacent splenule. Adrenals/Urinary Tract: Adrenal glands are unremarkable. No hydronephrosis or nephrolithiasis. Tiny right renal cysts, too small to characterize. The bladder is moderately distended. Stomach/Bowel: The stomach is within normal limits. There is no evidence of bowel obstruction.The appendix is normal. Vascular/Lymphatic: Aortoiliac atherosclerosis. No AAA. There is an enlarged left inguinal lymph node measuring up to 1.6 cm short axis (series 2, image 82). There is an enlarged left external iliac lymph node measuring up to 1.7 cm short axis (series 2, image 72). Reproductive: Unremarkable. Other: No abdominal wall hernia or abnormality. No abdominopelvic ascites. Musculoskeletal: There is no acute osseous abnormality. There are multilevel degenerative changes of the spine. Grade 1 listhesis at L4-L5, degenerative related to facet arthropathy. 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 There is a right-sided synovial herniation pit at the femoral head neck junction. There are no suspicious osseous lesions. There is extensive subcutaneous soft tissue swelling along the lower back. There is associated skin thickening. There is no focal fluid collection. There is no soft tissue gas. IMPRESSION: Extensive skin thickening and subcutaneous soft tissue swelling along the lower back, as can be seen in cellulitis. No soft tissue gas or evidence of soft tissue abscess. Enlarged left inguinal and external iliac lymph nodes, could be reactive.  Recommend follow-up ultrasound in 3 months to ensure resolution. Electronically Signed   By: JMaurine SimmeringM.D.   On: 11/04/2021 18:03    Assessment and Plan: This is a 61y.o. female with an ulcerated sacral wound.  --Discussed with the patient that the wound is ulcerated and necrotic, and without debridement, it would not be able to heal.  Discussed the need for debridement down to healthy tissue.  Based on CT scan, there is no abscess, so at this point cannot tell the patient how deep the wound will end up being.  If superficial, then she may only need wet to dry dressing changes.  However, if deeper, then she would likely benefit from wound vac dressing changes, particularly since she lives alone. --Also discussed with her the importance of being compliant with her diabetes management, as better glucose control will be of most help to get this wound to heal better. --Discussed with her the surgery at length, including risks of bleeding, injury to surrounding structures, the possibility of needing more debridement, potential dressings after surgery, and she's willing to proceed.  Will post her for surgery this afternoon pending OR team availability.   I spent 60 minutes dedicated to the care of this patient on the date of this encounter to include pre-visit review of records, face-to-face time with the patient discussing diagnosis and management, and any post-visit coordination of care.   JMelvyn Neth MD [facility] Pg:  [phone]

## 2021-11-05 NOTE — Anesthesia Preprocedure Evaluation (Addendum)
Anesthesia Evaluation  Patient identified by MRN, date of birth, ID band Patient awake    Reviewed: Allergy & Precautions, NPO status , Patient's Chart, lab work & pertinent test results  History of Anesthesia Complications Negative for: history of anesthetic complications  Airway Mallampati: III  TM Distance: >3 FB Neck ROM: full    Dental  (+) Chipped, Poor Dentition   Pulmonary neg shortness of breath, COPD, Current Smoker and Patient abstained from smoking.,  Bronchitis   Pulmonary exam normal        Cardiovascular Exercise Tolerance: Good hypertension, Pt. on medications (-) angina(-) Past MI and (-) DOE Normal cardiovascular exam     Neuro/Psych negative neurological ROS  negative psych ROS   GI/Hepatic negative GI ROS, Neg liver ROS, neg GERD  ,  Endo/Other  diabetes, Poorly Controlled, Type 2, Oral Hypoglycemic Agents  Renal/GU      Musculoskeletal   Abdominal (+) + obese,   Peds  Hematology negative hematology ROS (+)   Anesthesia Other Findings gluteal abscess   In the ED, febrile with temp 100.9 F, tachycardic HR 101.  Labs notable for Na 132, glucose 253, normal renal function, lactate 2.1, WBC 20k.  Past Medical History: No date: Bronchitis     Comment:  chronic No date: Diabetes mellitus without complication (HCC) No date: Hypertension  Past Surgical History: No date: CARPAL TUNNEL RELEASE; Right No date: CHOLECYSTECTOMY No date: TONSILLECTOMY     Comment:  age 72 or 52  BMI    Body Mass Index: 33.61 kg/m      Reproductive/Obstetrics negative OB ROS                          Anesthesia Physical Anesthesia Plan  ASA: 3  Anesthesia Plan: General ETT   Post-op Pain Management:    Induction: Intravenous  PONV Risk Score and Plan: 3 and Ondansetron, Midazolam and Dexamethasone  Airway Management Planned: Oral ETT  Additional Equipment:   Intra-op Plan:    Post-operative Plan: Extubation in OR  Informed Consent: I have reviewed the patients History and Physical, chart, labs and discussed the procedure including the risks, benefits and alternatives for the proposed anesthesia with the patient or authorized representative who has indicated his/her understanding and acceptance.     Dental Advisory Given  Plan Discussed with: Anesthesiologist, CRNA and Surgeon  Anesthesia Plan Comments: (Patient consented for risks of anesthesia including but not limited to:  - adverse reactions to medications - damage to eyes, teeth, lips or other oral mucosa - nerve damage due to positioning  - sore throat or hoarseness - Damage to heart, brain, nerves, lungs, other parts of body or loss of life  Patient voiced understanding.)       Anesthesia Quick Evaluation

## 2021-11-05 NOTE — Assessment & Plan Note (Signed)
Body mass index is 36.58 kg/m. Complicates overall care and prognosis.  Recommend lifestyle modifications including physical activity and diet for weight loss and overall long-term health.

## 2021-11-05 NOTE — Assessment & Plan Note (Signed)
No acute respiratory symptoms at time of admission.  Supportive care with mucolytic's and as needed bronchodilators

## 2021-11-05 NOTE — Anesthesia Procedure Notes (Signed)
Procedure Name: Intubation Date/Time: 11/05/2021 1:35 PM  Performed by: Lerry Liner, CRNAPre-anesthesia Checklist: Patient identified, Emergency Drugs available, Suction available and Patient being monitored Patient Re-evaluated:Patient Re-evaluated prior to induction Oxygen Delivery Method: Circle system utilized Preoxygenation: Pre-oxygenation with 100% oxygen Induction Type: IV induction Ventilation: Mask ventilation without difficulty Laryngoscope Size: McGraph and 3 Grade View: Grade I Tube type: Oral Tube size: 7.0 mm Number of attempts: 1 Airway Equipment and Method: Stylet, Oral airway and Video-laryngoscopy Placement Confirmation: ETT inserted through vocal cords under direct vision, positive ETCO2 and breath sounds checked- equal and bilateral Secured at: 21 cm Tube secured with: Tape Dental Injury: Teeth and Oropharynx as per pre-operative assessment

## 2021-11-05 NOTE — Assessment & Plan Note (Addendum)
Patient met criteria for sepsis on admission with fever, tachycardia and leukocytosis in the setting of cellulitis. -- Manage cellulitis as outlined -- Monitor fever curve, CBC, hemodynamics closely -- Antibiotics as outlined for cellulitis

## 2021-11-05 NOTE — Assessment & Plan Note (Signed)
Present on admission.  I agree with the wound description as outlined.  Patient now has a wound VAC status postdebridement of cellulitis in this area. Wound VAC changes and wound care per general surgery recommendations.

## 2021-11-05 NOTE — Assessment & Plan Note (Addendum)
On admission, patient reported not compliant with Lantus for about 2 weeks.  Hyperglycemic with fasting sugar in the 250s on admission. Uncontrolled, hemoglobin A1c 12.6% -- Oral medications were held during admission -- Stop glipizide due to risk of hypoglycemia -- Resume metformin at d/c -- Continue basal insulin at bedtime -- PCP follow up

## 2021-11-05 NOTE — Inpatient Diabetes Management (Signed)
Inpatient Diabetes Program Recommendations  AACE/ADA: New Consensus Statement on Inpatient Glycemic Control (2015)  Target Ranges:  Prepandial:   less than 140 mg/dL      Peak postprandial:   less than 180 mg/dL (1-2 hours)      Critically ill patients:  140 - 180 mg/dL   Lab Results  Component Value Date   GLUCAP 175 (H) 11/05/2021   HGBA1C 12.6 (H) 11/04/2021    Review of Glycemic Control  Latest Reference Range & Units 11/04/21 21:52 11/05/21 01:58 11/05/21 08:04 11/05/21 11:21  Glucose-Capillary 70 - 99 mg/dL 124 (H) 216 (H) 165 (H) 175 (H)   Diabetes history: DM 2 Outpatient Diabetes medications: Lantus 20 units qhs, Metformin 1000 mg bid, Victoza 1.2 mg weekly Current orders for Inpatient glycemic control:  Semglee 15 units Daily Novolog 0-15 units Q4 hours  A1c 12.6% on 7/13  Inpatient Diabetes Program Recommendations:    Spoke with pt over the phone regarding A1c of 12.6% and glucose control at home. Pt reports the last time she had her levels checked was earlier in the year and they were consistent with her levels that were obtained this admission. Pt reports knowing what to do, however, pt reports not following a Diabetic diet. Pt also reports that her sister reminds her some days to check her glucose. Encouraged setting reminders and alarms on phone for reminders. Described why glucose control in imperative in the current situation of present diagnosis. Pt reports she knows what she needs to do and will do better. Pt has all needed supplies for self management.  Thanks,  Tama Headings RN, MSN, BC-ADM Inpatient Diabetes Coordinator Team Pager [phone] (8a-5p)

## 2021-11-05 NOTE — Transfer of Care (Signed)
Immediate Anesthesia Transfer of Care Note  Patient: Shaune A Casserly  Procedure(s) Performed: DEBRIDMENT OF DECUBITUS ULCER  Patient Location: PACU  Anesthesia Type:General  Level of Consciousness: drowsy  Airway & Oxygen Therapy: Patient Spontanous Breathing and Patient connected to face mask oxygen  Post-op Assessment: Report given to RN  Post vital signs: stable  Last Vitals:  Vitals Value Taken Time  BP 94/60 11/05/21 1455  Temp 36.2 C 11/05/21 1455  Pulse 89 11/05/21 1458  Resp 19 11/05/21 1458  SpO2 99 % 11/05/21 1458  Vitals shown include unvalidated device data.  Last Pain:  Vitals:   11/05/21 1455  TempSrc:   PainSc: Asleep         Complications: No notable events documented.

## 2021-11-05 NOTE — Hospital Course (Addendum)
61 y.o. female with medical history significant of  Chronic bronchitis, DMII not on insulin , HTN , recurrent abscess  who presented to ED on 11/04/2021 via EMS due to complaint of gluteal abscess progressive x 2 weeks.  Pt reported associated generalized body aches, headaches, increasing pain and drainage from the area.  Reported mild nausea, no emesis.  Denied any trauma to the area. Reported history of recurrent boils but notes usually 6 within a year.  She notes recently non-compliant with her long acting insulin the prior 2 weeks.  In the ED, febrile with temp 100.9 F, tachycardic HR 101.  Labs notable for Na 132, glucose 253, normal renal function, lactate 2.1, WBC 20k.  CT abdomen/pelvis showed extensive skin thickening and subcutaneous soft tissue swelling along the lower back consistent with cellulitis, no evidence of soft tissue gas or abscess, likely reactive left inguinal and external iliac lymph nodes.    Patient was admitted to the hospital, started on broad spectrum IV antibiotics.  General surgery consulted.

## 2021-11-05 NOTE — Op Note (Addendum)
  Procedure Date:  11/05/2021  Pre-operative Diagnosis:  Unstageable Sacral wound  Post-operative Diagnosis: Stage 3 sacral wound, 9 cm x 9.5 cm x 3 cm.  Procedure:   Debridement of sacral wound including skin and subcutaneous tissue, for total area of 85.5 cm2. Placement of negative pressure wound vac dressing, > 50 cm2  Surgeon:  Melvyn Neth, MD  Assistant:  Myra Rude, PA-S  Anesthesia:  General endotracheal  Estimated Blood Loss:  10 ml  Specimens:   Culture swab Sacral tissue for culture  Complications:  None  Indications for Procedure:  This is a 61 y.o. female with diagnosis of an unstageable sacral wound, requiring debridement procedure.  The risks of bleeding, abscess or infection, injury to surrounding structures, and need for further procedures were all discussed with the patient and was willing to proceed.  Description of Procedure: The patient was correctly identified in the preoperative area and brought into the operating room.  The patient was placed supine with VTE prophylaxis in place.  Appropriate time-outs were performed.  Anesthesia was induced and the patient was intubated.  Appropriate antibiotics were infused.  The patient was then placed in prone position.  The patient's sacral area was prepped and draped in usual sterile fashion.  The patient's wound initially measured about 6 cm in diameter, consisting of necrotic and ulcerated tissue.  Cautery was used to start incising around this diameter down the skin and into the subcutaneous space.  Culture swab was used to swab some of the purulent fluid that was exudating from the wound and this was sent for culture.  The superficial necrotic tissue of skin and subcutaneous tissue was excised and sent for tissue culture.  The deeper area and edges still had necrotic tissue and further resection needed to be done, also using cautery.  After final debridement down to healthy subcutaneous tissue, the final wound  measured 9 cm x 9.5 cm x 3 cm.  The cavity was thoroughly irrigated and cautery was used to achieve hemostasis.  After this, a black sponge wound vac dressing was applied over the wound, and it was bridged laterally to the right flank for more than > 50 total.  The suction disc was connected at the lateral edge of the sponge.  Good seal and suction was achieved.  The patient was then placed in supine position, emerged from anesthesia, extubated, and brought to the recovery room for further management.  The patient tolerated the procedure well and all counts were correct at the end of the case.   Melvyn Neth, MD

## 2021-11-05 NOTE — Progress Notes (Signed)
Progress Note   Patient: Jamie Rodriguez FBP:[id number] DOB: 14-Jun-1960 DOA: 11/04/2021     1 DOS: the patient was seen and examined on 11/05/2021   Brief hospital course: 61 y.o. female with medical history significant of  Chronic bronchitis, DMII not on insulin , HTN , recurrent abscess  who presented to ED on 11/04/2021 via EMS due to complaint of gluteal abscess progressive x 2 weeks.  Pt reported associated generalized body aches, headaches, increasing pain and drainage from the area.  Reported mild nausea, no emesis.  Denied any trauma to the area. Reported history of recurrent boils but notes usually 6 within a year.  She notes recently non-compliant with her long acting insulin the prior 2 weeks.  In the ED, febrile with temp 100.9 F, tachycardic HR 101.  Labs notable for Na 132, glucose 253, normal renal function, lactate 2.1, WBC 20k.  CT abdomen/pelvis showed extensive skin thickening and subcutaneous soft tissue swelling along the lower back consistent with cellulitis, no evidence of soft tissue gas or abscess, likely reactive left inguinal and external iliac lymph nodes.    Patient was admitted to the hospital, started on broad spectrum IV antibiotics.  General surgery consulted and plan to bring patient to the OR for debridement.    Assessment and Plan: * Cellulitis of gluteal region General surgery consulted and took patient OR for debridement today 7/14.  Patient has wound VAC in place. -- General surgery following -- Wound VAC change planned for Monday -- Continue empiric IV vancomycin and cefepime -- Follow cultures and de-escalate antibiotics as appropriate -- Pain control, antiemetics, antipyretics as needed  Sepsis [facility]) Patient met criteria for sepsis on admission with fever, tachycardia and leukocytosis in the setting of cellulitis. -- Manage cellulitis as outlined -- Monitor fever curve, CBC, hemodynamics closely -- Follow cultures  Type 2 diabetes mellitus ([place]) On  admission, patient reported not compliant with Lantus for about 2 weeks.  Hyperglycemic with fasting sugar in the 250s on admission. -- Follow-up pending A1c -- Hold oral medications -- Continue basal insulin and sliding scale coverage -- Adjust insulin as needed for goal 140-180  Obesity (BMI 30-39.9) Body mass index is 36.58 kg/m. Complicates overall care and prognosis.  Recommend lifestyle modifications including physical activity and diet for weight loss and overall long-term health.   Essential hypertension Antihypertensives on hold while NPO, resume when taking p.o. and if BP elevated  Chronic bronchitis (HCC) No acute respiratory symptoms at time of admission.  Supportive care with mucolytic's and as needed bronchodilators  Pressure injury of sacral region, stage 3 ([place]) Present on admission.  I agree with the wound description as outlined.  Patient now has a wound VAC status postdebridement of cellulitis in this area. Wound VAC changes and wound care per general surgery recommendations.        Subjective: Patient seen on the floor after returning from the OR for debridement today.  She denies pain.  Is having a little bit of phlegm production since waking up from anesthesia.  Denies fevers chills.  She states she is overall feeling much better than when she first came in.  No other acute complaints.  Physical Exam: Vitals:   11/05/21 1530 11/05/21 1545 11/05/21 1600 11/05/21 1616  BP: 93/62 (!) _0  Pulse: 86 84 86 79  Resp: _1 Temp:    97.6 F (36.4 C)  TempSrc:      SpO2: 97% 96% 97% 97%  Weight:  99.7 kg     Height:       General exam: awake, alert, no acute distress, obese HEENT: atraumatic, clear conjunctiva, anicteric sclera, moist mucus membranes, hearing grossly normal  Respiratory system: CTAB, no wheezes, rales or rhonchi, normal respiratory effort. Cardiovascular system: normal S1/S2, RRR, no JVD, murmurs, rubs, gallops, no pedal  edema.   Gastrointestinal system: soft, NT, ND, no HSM felt, +bowel sounds. Central nervous system: A&O x3. no gross focal neurologic deficits, normal speech Extremities: moves all, no edema, normal tone Skin: dry, intact, normal temperature, normal color, No rashes, lesions or ulcers seen on visualized skin Psychiatry: normal mood, congruent affect, judgement and insight appear normal   Data Reviewed:  Notable labs: Glucose 176, calcium 8.6, albumin 2.7, CRP 22.6, WBC 17.9, INR 1.3  Family Communication: None  Disposition: Status is: Inpatient Remains inpatient appropriate because: Severity of illness remaining on IV antibiotics pending culture results.  Initial wound VAC change planned for Monday per general surgery   Planned Discharge Destination: Home with Home Health    Time spent: 40 minutes  Author: Ezekiel Slocumb, DO 11/05/2021 4:35 PM  For on call review [email or web address].

## 2021-11-05 NOTE — Assessment & Plan Note (Addendum)
Antihypertensives held while NPO. BP remain normal and soft at times. HCTZ stopped to avoid causing hypotension. PCP follow up

## 2021-11-05 NOTE — Assessment & Plan Note (Addendum)
General surgery consulted and took patient OR for debridement on 7/14.   Patient has wound VAC in place. -- General surgery follow-up -- Wound VAC changes MWF -- Home health nursing set up for assistance --Treated with empiric IV vancomycin and cefepime --Discharge on cefadroxil for 10 more days to complete 2-week course -- Pain control, antiemetics, antipyretics as needed

## 2021-11-05 NOTE — ED Notes (Signed)
Lab called to draw blood work on pt

## 2021-11-06 DIAGNOSIS — L89153 Pressure ulcer of sacral region, stage 3: Secondary | ICD-10-CM

## 2021-11-06 DIAGNOSIS — L03317 Cellulitis of buttock: Secondary | ICD-10-CM

## 2021-11-06 LAB — GLUCOSE, CAPILLARY
Glucose-Capillary: 257 mg/dL — ABNORMAL HIGH (ref 70–99)
Glucose-Capillary: 140 mg/dL — ABNORMAL HIGH (ref 70–99)
Glucose-Capillary: 147 mg/dL — ABNORMAL HIGH (ref 70–99)
Glucose-Capillary: 160 mg/dL — ABNORMAL HIGH (ref 70–99)
Glucose-Capillary: 112 mg/dL — ABNORMAL HIGH (ref 70–99)

## 2021-11-06 LAB — CBC
HCT: 34.7 % — ABNORMAL LOW (ref 36.0–46.0)
Hemoglobin: 11.2 g/dL — ABNORMAL LOW (ref 12.0–15.0)
MCH: 28.3 pg (ref 26.0–34.0)
MCHC: 32.3 g/dL (ref 30.0–36.0)
MCV: 87.6 fL (ref 80.0–100.0)
Platelets: 304 10*3/uL (ref 150–400)
RBC: 3.96 MIL/uL (ref 3.87–5.11)
RDW: 12.4 % (ref 11.5–15.5)
WBC: 16.8 10*3/uL — ABNORMAL HIGH (ref 4.0–10.5)
nRBC: 0 % (ref 0.0–0.2)

## 2021-11-06 LAB — BASIC METABOLIC PANEL
Anion gap: 6 (ref 5–15)
BUN: 18 mg/dL (ref 8–23)
CO2: 20 mmol/L — ABNORMAL LOW (ref 22–32)
Calcium: 8.3 mg/dL — ABNORMAL LOW (ref 8.9–10.3)
Chloride: 109 mmol/L (ref 98–111)
Creatinine, Ser: 0.86 mg/dL (ref 0.44–1.00)
GFR, Estimated: >60 mL/min (ref >60)
Glucose, Bld: 201 mg/dL — ABNORMAL HIGH (ref 70–99)
Potassium: 3.9 mmol/L (ref 3.5–5.1)
Sodium: 135 mmol/L (ref 135–145)

## 2021-11-06 LAB — MAGNESIUM: Magnesium: 2.1 mg/dL (ref 1.7–2.4)

## 2021-11-06 MED ORDER — INSULIN GLARGINE-YFGN 100 UNIT/ML ~~LOC~~ SOLN
18.0000 [IU] | Freq: Every day | SUBCUTANEOUS | Status: DC
  Administered 2021-11-06 – 2021-11-08 (×3): 18 [IU] via SUBCUTANEOUS
  Filled 2021-11-06 (×3): qty 0.18

## 2021-11-06 MED ORDER — INSULIN ASPART 100 UNIT/ML IJ SOLN
0.0000 [IU] | Freq: Three times a day (TID) | INTRAMUSCULAR | Status: DC
  Administered 2021-11-06: 3 [IU] via SUBCUTANEOUS
  Administered 2021-11-06 (×2): 2 [IU] via SUBCUTANEOUS
  Administered 2021-11-07 (×2): 3 [IU] via SUBCUTANEOUS
  Filled 2021-11-06 (×6): qty 1

## 2021-11-06 MED ORDER — INSULIN ASPART 100 UNIT/ML IJ SOLN
6.0000 [IU] | Freq: Three times a day (TID) | INTRAMUSCULAR | Status: DC
  Administered 2021-11-06 – 2021-11-08 (×9): 6 [IU] via SUBCUTANEOUS
  Filled 2021-11-06 (×9): qty 1

## 2021-11-06 NOTE — Consult Note (Signed)
WOC Nurse Consult Note: Consult received for NPWT dressing changes to sacral wound to begin on Monday, 11/08/21.  [place] nursing team will follow, and will remain available to this patient, the nursing, surgical, and medical teams.   Thank you for inviting [place] to participate in this patient's Plan of Care.  Maudie Flakes, MSN, RN, CNS, [place], Serita Grammes, [employer], [employer] phone:  [phone]

## 2021-11-06 NOTE — Progress Notes (Signed)
[facility] Hospital Day(s): 2.   Post op day(s): 1 Day Post-Op.   Interval History: Patient seen and examined, no acute events or new complaints overnight. Patient reports having no pain or issues with the wound VAC present.  Was sitting up at her bed, attending to her personal hygiene, without issue.  Minimal sanguinous fluid noted in vacuum cartridge.   Vital signs in last 24 hours: [min-max] current  Temp:  [97.2 F (36.2 C)-99.1 F (37.3 C)] 98.1 F (36.7 C) (07/15 0743) Pulse Rate:  [66-98] 72 (07/15 0743) Resp:  [15-20] 16 (07/15 0743) BP: (88-124)/(55-69) 98/66 (07/15 0743) SpO2:  [96 %-100 %] 100 % (07/15 0743) Weight:  [91.6 kg-99.7 kg] 95.8 kg (07/15 0637)     Height: '5\' 5"'$  (165.1 cm) Weight: 95.8 kg BMI (Calculated): 35.13   Intake/Output last 2 shifts:  07/14 0701 - 07/15 0700 In: 2419.7 [P.O.:350; I.V.:1495.5; IV Piggyback:574.2] Out: 421 [Urine:421]   Physical Exam:  Constitutional: alert, cooperative and no distress  Respiratory: Nonlabored. Integumentary: Wound VAC in place.  Without leak.  Labs:     Latest Ref Rng & Units 11/06/2021    5:29 AM 11/05/2021    6:29 AM 11/04/2021    4:15 PM  CBC  WBC 4.0 - 10.5 K/uL 16.8  17.9  20.6   Hemoglobin 12.0 - 15.0 g/dL 11.2  12.3  12.8   Hematocrit 36.0 - 46.0 % 34.7  37.7  39.8   Platelets 150 - 400 K/uL 304  293  304       Latest Ref Rng & Units 11/06/2021    5:29 AM 11/05/2021    6:29 AM 11/04/2021    4:15 PM  CMP  Glucose 70 - 99 mg/dL 201  176  253   BUN 8 - 23 mg/dL '18  13  12   '$ Creatinine 0.44 - 1.00 mg/dL 0.86  0.71  0.81   Sodium 135 - 145 mmol/L 135  136  132   Potassium 3.5 - 5.1 mmol/L 3.9  3.5  3.8   Chloride 98 - 111 mmol/L 109  107  98   CO2 22 - 32 mmol/L '20  23  23   '$ Calcium 8.9 - 10.3 mg/dL 8.3  8.6  9.2   Total Protein 6.5 - 8.1 g/dL  7.1  7.9   Total Bilirubin 0.3 - 1.2 mg/dL  0.6  0.6   Alkaline Phos 38 - 126 U/L  81  86   AST 15 - 41 U/L  32   31   ALT 0 - 44 U/L  29  26      Imaging studies: No new pertinent imaging studies   Assessment/Plan:  61 y.o. female with  1 Day Post-Op s/p excisional debridement of sacral decubitus with negative pressure wound therapy application, complicated by pertinent comorbidities:  Patient Active Problem List   Diagnosis Date Noted   Sepsis ([place]) 11/05/2021   Type 2 diabetes mellitus ([place]) 11/05/2021   Chronic bronchitis ([place]) 11/05/2021   Essential hypertension 11/05/2021   Obesity (BMI 30-39.9) 11/05/2021   Sacral wound    Pressure injury of sacral region, stage 3 ([place])    Cellulitis of gluteal region 11/04/2021    -Continue wound VAC, anticipate initial change Monday.  -We will follow peripherally, readily available should need arise.     All of the above findings and recommendations were discussed with the patient, and all of patient's questions were answered to their expressed satisfaction.  --  Ronny Bacon, M.D., [facility] 11/06/2021

## 2021-11-06 NOTE — Progress Notes (Addendum)
Progress Note   Patient: Jamie Rodriguez GYI:[id number] DOB: 24-Apr-1961 DOA: 11/04/2021     2 DOS: the patient was seen and examined on 11/06/2021   Brief hospital course: 61 y.o. female with medical history significant of  Chronic bronchitis, DMII not on insulin , HTN , recurrent abscess  who presented to ED on 11/04/2021 via EMS due to complaint of gluteal abscess progressive x 2 weeks.  Pt reported associated generalized body aches, headaches, increasing pain and drainage from the area.  Reported mild nausea, no emesis.  Denied any trauma to the area. Reported history of recurrent boils but notes usually 6 within a year.  She notes recently non-compliant with her long acting insulin the prior 2 weeks.  In the ED, febrile with temp 100.9 F, tachycardic HR 101.  Labs notable for Na 132, glucose 253, normal renal function, lactate 2.1, WBC 20k.  CT abdomen/pelvis showed extensive skin thickening and subcutaneous soft tissue swelling along the lower back consistent with cellulitis, no evidence of soft tissue gas or abscess, likely reactive left inguinal and external iliac lymph nodes.    Patient was admitted to the hospital, started on broad spectrum IV antibiotics.  General surgery consulted and plan to bring patient to the OR for debridement.    Assessment and Plan: * Cellulitis of gluteal region General surgery consulted and took patient OR for debridement today 7/14.  Patient has wound VAC in place. -- General surgery following -- Wound VAC change planned for Monday -- Continue empiric IV vancomycin and cefepime -- Follow cultures and de-escalate antibiotics as appropriate -- Pain control, antiemetics, antipyretics as needed  Sepsis [facility]) Patient met criteria for sepsis on admission with fever, tachycardia and leukocytosis in the setting of cellulitis. -- Manage cellulitis as outlined -- Monitor fever curve, CBC, hemodynamics closely -- Follow cultures  Type 2 diabetes mellitus ([place]) On  admission, patient reported not compliant with Lantus for about 2 weeks.  Hyperglycemic with fasting sugar in the 250s on admission. Uncontrolled, hemoglobin A1c 12.6% -- Hold oral medications -- Continue basal insulin --Add 6 units mealtime NovoLog 3 times daily and continue sliding scale -- Adjust insulin as needed for goal 140-180  Obesity (BMI 30-39.9) Body mass index is 36.58 kg/m. Complicates overall care and prognosis.  Recommend lifestyle modifications including physical activity and diet for weight loss and overall long-term health.   Essential hypertension Antihypertensives on hold while NPO, resume when taking p.o. and if BP elevated  Chronic bronchitis (HCC) No acute respiratory symptoms at time of admission.  Supportive care with mucolytic's and as needed bronchodilators  Pressure injury of sacral region, stage 3 ([place]) Present on admission.  I agree with the wound description as outlined.  Patient now has a wound VAC status postdebridement of cellulitis in this area. Wound VAC changes and wound care per general surgery recommendations.        Subjective: Patient seen seated edge of bed on rounds this morning.  She reported being frustrated by her overall situation.  She had called for breakfast and some meal trays delivered on the hallway but she not yet received breakfast.  Reports pain controlled fairly well at this point.  Physical Exam: Vitals:   11/06/21 0743 11/06/21 1117 11/06/21 1243 11/06/21 1517  BP: 98/66 137/78 134/80 117/69  Pulse: 72 79 80 76  Resp: $Remo'16 16 19 18  'XFURf$ Temp: 98.1 F (36.7 C)  98.2 F (36.8 C)   TempSrc: Oral     SpO2: 100% 100% 100%  100%  Weight:      Height:       General exam: awake seated edge of bed, alert, no acute distress, obese HEENT: Clear conjunctiva, anicteric sclera, moist mucous membranes, hearing grossly normal l  Respiratory system: Lungs clear without wheezes or rhonchi, normal respiratory effort on room  air Cardiovascular system: Regular rate and rhythm, no peripheral edema Gastrointestinal system: Abdomen soft nontender nondistended with positive bowel sounds Central nervous system: A&O x3. no gross focal neurologic deficits, normal speech Extremities: moves all, no edema, normal tone Skin: Wound VAC in place with dark serosanguineous fluid in the canister Psychiatry: normal mood, congruent affect, judgement and insight appear normal   Data Reviewed:  Notable labs: Bicarb 20, glucose 201, calcium 8.3, WBC 16.8, hemoglobin 11.2.  CBGs today 140, 147 at goal  Micro --abscess wound cultures with moderate gram-positive cocci, abundant Staph aureus --pending.  Blood cultures no growth at 2 days  Family Communication: None  Disposition: Status is: Inpatient Remains inpatient appropriate because: Severity of illness remaining on IV antibiotics pending culture results.  Initial wound VAC change planned for Monday per general surgery   Planned Discharge Destination: Home with Home Health    Time spent: 35 minutes  Author: Ezekiel Slocumb, DO 11/06/2021 3:25 PM  For on call review [email or web address].

## 2021-11-07 DIAGNOSIS — L89153 Pressure ulcer of sacral region, stage 3: Secondary | ICD-10-CM

## 2021-11-07 DIAGNOSIS — L03317 Cellulitis of buttock: Secondary | ICD-10-CM

## 2021-11-07 LAB — CBC
HCT: 35.6 % — ABNORMAL LOW (ref 36.0–46.0)
Hemoglobin: 11.6 g/dL — ABNORMAL LOW (ref 12.0–15.0)
MCH: 28.9 pg (ref 26.0–34.0)
MCHC: 32.6 g/dL (ref 30.0–36.0)
MCV: 88.8 fL (ref 80.0–100.0)
Platelets: 321 10*3/uL (ref 150–400)
RBC: 4.01 MIL/uL (ref 3.87–5.11)
RDW: 12.6 % (ref 11.5–15.5)
WBC: 14.2 10*3/uL — ABNORMAL HIGH (ref 4.0–10.5)
nRBC: 0 % (ref 0.0–0.2)

## 2021-11-07 LAB — GLUCOSE, CAPILLARY
Glucose-Capillary: 138 mg/dL — ABNORMAL HIGH (ref 70–99)
Glucose-Capillary: 115 mg/dL — ABNORMAL HIGH (ref 70–99)
Glucose-Capillary: 182 mg/dL — ABNORMAL HIGH (ref 70–99)
Glucose-Capillary: 169 mg/dL — ABNORMAL HIGH (ref 70–99)
Glucose-Capillary: 123 mg/dL — ABNORMAL HIGH (ref 70–99)

## 2021-11-07 NOTE — TOC CM/SW Note (Signed)
TOC following for discharge needs. Patient now with wound vac. Attending has consulted PT and OT. Will follow up with patient once recommendations have been made.  Dayton Scrape, [place]

## 2021-11-07 NOTE — Evaluation (Signed)
Occupational Therapy Evaluation Patient Details Name: Jamie Rodriguez MRN: [id number] DOB: [birth year] Today's Date: 11/07/2021   History of Present Illness Pt is a 61 y/o F admitted on 11/04/21 after presenting to the ED with c/o gluteal abscess x 2 weeks. Pt is being treated for cellulitis of gluteal region. Pt underwent debridement on 11/05/21 & now has wound vac placed. PMH: chronic bronchitis, DM2 not on insulin, HTN, recurrent abscess   Clinical Impression   Jamie Rodriguez presents to OT with decreased range of motion and low activity tolerance that impacts her ability to safely and independently complete self care tasks.  Prior to admission, patient lived alone and was able to complete basic ADLs with mod I.  Currently, patient is able to complete basic ADLs with setup-min assist intermittently.  She requires assist for lower body dressing due to limited ROM, as well as limited endurance while managing wound vac.  OT provided education re: adaptive equipment for lower body dressing as well as DME to support safety and independence after discharge. Jamie Rodriguez will likely continue to benefit from skilled OT services in acute setting to support safety and independence in ADLs.  Recommend HHOT upon discharge to further address these goals.      Recommendations for follow up therapy are one component of a multi-disciplinary discharge planning process, led by the attending physician.  Recommendations may be updated based on patient status, additional functional criteria and insurance authorization.   Follow Up Recommendations  Home health OT    Assistance Recommended at Discharge PRN  Patient can return home with the following Assist for transportation    Functional Status Assessment  Patient has had a recent decline in their functional status and demonstrates the ability to make significant improvements in function in a reasonable and predictable amount of time.  Equipment Recommendations  Tub/shower  bench    Recommendations for Other Services       Precautions / Restrictions Precautions Precautions: None Restrictions Weight Bearing Restrictions: No      Mobility Bed Mobility Overal bed mobility: Modified Independent               Patient Response: Cooperative  Transfers Overall transfer level: Independent Equipment used: None               General transfer comment: STS without AD      Balance Overall balance assessment: Modified Independent   Sitting balance-Leahy Scale: Normal     Standing balance support: During functional activity, No upper extremity supported Standing balance-Leahy Scale: Good                             ADL either performed or assessed with clinical judgement   ADL Overall ADL's : Needs assistance/impaired                                       General ADL Comments: Patient is grossly able to perform ADLs with setup-min assist, requiring intermittent assist to manage wound vac and reach B feet for dressing     Vision Patient Visual Report: No change from baseline       Perception     Praxis      Pertinent Vitals/Pain Pain Assessment Pain Assessment: No/denies pain Pain Intervention(s): Limited activity within patient's tolerance, Monitored during session     Hand Dominance  Extremity/Trunk Assessment Upper Extremity Assessment Upper Extremity Assessment: Overall WFL for tasks assessed   Lower Extremity Assessment Lower Extremity Assessment: Overall WFL for tasks assessed       Communication Communication Communication: No difficulties   Cognition Arousal/Alertness: Awake/alert Behavior During Therapy: WFL for tasks assessed/performed Overall Cognitive Status: Within Functional Limits for tasks assessed                                       General Comments       Exercises Other Exercises Other Exercises: provided education re: OT role and plan of care,  fall and safety precautions, discharge recommendations, DME and AE for ADLs   Shoulder Instructions      Home Living Family/patient expects to be discharged to:: Private residence Living Arrangements: Alone Available Help at Discharge: Family;Available PRN/intermittently Type of Home: Apartment Home Access: Level entry     Home Layout: One level     Bathroom Shower/Tub: Teacher, early years/pre: Standard     Home Equipment: Grab bars - tub/shower;Hand held shower head          Prior Functioning/Environment Prior Level of Function : Independent/Modified Independent             Mobility Comments: Pt reports she lives alone & is independent with all mobility, has her license but hasn't driven in over a year (reports her car is in the shop & her family takes her places PRN). Pt denies falls in the past 6 months. ADLs Comments: Pt is independent in ADLs at baseline.        OT Problem List: Decreased strength;Decreased range of motion;Decreased knowledge of use of DME or AE      OT Treatment/Interventions: Self-care/ADL training;Therapeutic exercise;Energy conservation;DME and/or AE instruction;Therapeutic activities;Patient/family education    OT Goals(Current goals can be found in the care plan section) Acute Rehab OT Goals Patient Stated Goal: to return home safely OT Goal Formulation: With patient Time For Goal Achievement: 11/21/21 Potential to Achieve Goals: Good  OT Frequency: Min 1X/week    Co-evaluation              AM-PAC OT "6 Clicks" Daily Activity     Outcome Measure Help from another person eating meals?: None Help from another person taking care of personal grooming?: A Little Help from another person toileting, which includes using toliet, bedpan, or urinal?: A Little Help from another person bathing (including washing, rinsing, drying)?: A Little Help from another person to put on and taking off regular upper body clothing?:  None Help from another person to put on and taking off regular lower body clothing?: A Little 6 Click Score: 20   End of Session    Activity Tolerance: Patient tolerated treatment well Patient left: in bed;with call bell/phone within reach  OT Visit Diagnosis: Other abnormalities of gait and mobility (R26.89);Muscle weakness (generalized) (M62.81)                Time: 3664-4034 OT Time Calculation (min): 16 min Charges:  OT General Charges $OT Visit: 1 Visit OT Evaluation $OT Eval Moderate Complexity: 1 Mod  Prentice Sackrider, OTR/L 11/07/21, 4:04 PM

## 2021-11-07 NOTE — TOC Initial Note (Signed)
Transition of Care [facility]) - Initial/Assessment Note    Patient Details  Name: Jamie Rodriguez MRN: [id number] Date of Birth: [birth year]  Transition of Care [facility]) CM/SW Contact:    Candie Chroman, LCSW Phone Number: 11/07/2021, 3:42 PM  Clinical Narrative:  CSW met with patient. No supports at bedside. CSW introduced role and explained that discharge planning would be discussed. Sent wound vac referral information by secure email to [facility] representative. Patient does not have a preference in regards to the home health agency. [facility] is reviewing referral for RN only. Did let patient know that there have been instances where we could not find home health and patients had to go to office for dressing changes. Patient said this will not be an issue and she has transportation through both [facility] Medicare and Medicaid. No further concerns. CSW encouraged patient to contact CSW as needed. CSW will continue to follow patient for support and facilitate discharge home once medically stable.               Expected Discharge Plan: [place] Barriers to Discharge: Continued Medical Work up   Patient Goals and CMS Choice        Expected Discharge Plan and Services Expected Discharge Plan: [place] Choice: Home Health, Durable Medical Equipment Living arrangements for the past 2 months: Apartment                 DME Arranged: Vac DME Agency: [facility] Date DME Agency Contacted: 11/07/21   Representative spoke with at DME Agency: Leontine Locket            Prior Living Arrangements/Services Living arrangements for the past 2 months: Apartment Lives with:: Self Patient language and need for interpreter reviewed:: Yes Do you feel safe going back to the place where you live?: Yes      Need for Family Participation in Patient Care: Yes (Comment)     Criminal Activity/Legal Involvement Pertinent to Current Situation/Hospitalization: No - Comment  as needed  Activities of Daily Living Home Assistive Devices/Equipment: None ADL Screening (condition at time of admission) Patient's cognitive ability adequate to safely complete daily activities?: Yes Is the patient deaf or have difficulty hearing?: No Does the patient have difficulty seeing, even when wearing glasses/contacts?: No Does the patient have difficulty concentrating, remembering, or making decisions?: No Patient able to express need for assistance with ADLs?: Yes Does the patient have difficulty dressing or bathing?: No Independently performs ADLs?: Yes (appropriate for developmental age) Does the patient have difficulty walking or climbing stairs?: No Weakness of Legs: None Weakness of Arms/Hands: None  Permission Sought/Granted Permission sought to share information with : Facility Art therapist granted to share information with : Yes, Verbal Permission Granted     Permission granted to share info w AGENCY: Home Health Agencies        Emotional Assessment Appearance:: Appears stated age Attitude/Demeanor/Rapport: Engaged, Gracious Affect (typically observed): Accepting, Appropriate, Calm, Pleasant Orientation: : Oriented to Self, Oriented to Place, Oriented to  Time, Oriented to Situation Alcohol / Substance Use: Not Applicable Psych Involvement: No (comment)  Admission diagnosis:  Cellulitis of buttock [L03.317] Cellulitis [L03.90] Tobacco abuse [Z72.0] Hyperglycemia [R73.9] Sepsis, due to unspecified organism, unspecified whether acute organ dysfunction present [facility]) [A41.9] Patient Active Problem List   Diagnosis Date Noted   Sepsis ([place]) 11/05/2021   Type 2 diabetes mellitus ([place]) 11/05/2021   Chronic bronchitis ([place])  11/05/2021   Essential hypertension 11/05/2021   Obesity (BMI 30-39.9) 11/05/2021   Sacral wound    Pressure injury of sacral region, stage 3 ([place])    Cellulitis of gluteal region 11/04/2021   PCP:  Marguerita Merles,  MD Pharmacy:   [facility] [phone] - [address], [place] AT [facility] OF [address] [place] [address] Phone: [phone] Fax: [phone]     Social Determinants of Health (SDOH) Interventions    Readmission Risk Interventions     No data to display

## 2021-11-07 NOTE — Progress Notes (Signed)
Progress Note   Patient: Jamie Rodriguez UMP:[id number] DOB: August 05, 1960 DOA: 11/04/2021     3 DOS: the patient was seen and examined on 11/07/2021   Brief hospital course: 61 y.o. female with medical history significant of  Chronic bronchitis, DMII not on insulin , HTN , recurrent abscess  who presented to ED on 11/04/2021 via EMS due to complaint of gluteal abscess progressive x 2 weeks.  Pt reported associated generalized body aches, headaches, increasing pain and drainage from the area.  Reported mild nausea, no emesis.  Denied any trauma to the area. Reported history of recurrent boils but notes usually 6 within a year.  She notes recently non-compliant with her long acting insulin the prior 2 weeks.  In the ED, febrile with temp 100.9 F, tachycardic HR 101.  Labs notable for Na 132, glucose 253, normal renal function, lactate 2.1, WBC 20k.  CT abdomen/pelvis showed extensive skin thickening and subcutaneous soft tissue swelling along the lower back consistent with cellulitis, no evidence of soft tissue gas or abscess, likely reactive left inguinal and external iliac lymph nodes.    Patient was admitted to the hospital, started on broad spectrum IV antibiotics.  General surgery consulted and plan to bring patient to the OR for debridement.    Assessment and Plan: * Cellulitis of gluteal region General surgery consulted and took patient OR for debridement today 7/14.  Patient has wound VAC in place. -- General surgery following -- Wound VAC change planned for Monday -- Continue empiric IV vancomycin and cefepime -- Follow cultures and de-escalate antibiotics as appropriate -- Pain control, antiemetics, antipyretics as needed  Sepsis [facility]) Patient met criteria for sepsis on admission with fever, tachycardia and leukocytosis in the setting of cellulitis. -- Manage cellulitis as outlined -- Monitor fever curve, CBC, hemodynamics closely -- Follow cultures  Type 2 diabetes mellitus ([place]) On  admission, patient reported not compliant with Lantus for about 2 weeks.  Hyperglycemic with fasting sugar in the 250s on admission. Uncontrolled, hemoglobin A1c 12.6% -- Hold oral medications -- Continue basal insulin --Add 6 units mealtime NovoLog 3 times daily and continue sliding scale -- Adjust insulin as needed for goal 140-180  Obesity (BMI 30-39.9) Body mass index is 36.58 kg/m. Complicates overall care and prognosis.  Recommend lifestyle modifications including physical activity and diet for weight loss and overall long-term health.   Essential hypertension Antihypertensives on hold while NPO, resume when taking p.o. and if BP elevated  Chronic bronchitis ([facility]) No acute respiratory symptoms at time of admission.  Supportive care with mucolytic's and as needed bronchodilators  Pressure injury of sacral region, stage 3 ([place]) Present on admission.  I agree with the wound description as outlined.  Patient now has a wound VAC status postdebridement of cellulitis in this area. Wound VAC changes and wound care per general surgery recommendations.        Subjective: Patient seen seated edge of bed on rounds this morning.  She was talking to her sister on the room phone and had me speak with her for updates and answered her questions.  Patient reports pain is controlled.  Overall reports feeling well.  No acute complaints at this time  Physical Exam: Vitals:   11/06/21 1924 11/07/21 0044 11/07/21 0325 11/07/21 0656  BP: 111/66 108/80 102/75 139/77  Pulse: 95 84 61 60  Resp: _0 Temp: 98.9 F (37.2 C) 98.6 F (37 C) 98.7 F (37.1 C) 97.9 F (36.6 C)  TempSrc:  Oral Oral Oral   SpO2: 100% 100% 99% 100%  Weight:      Height:       General exam: awake seated edge of bed, alert, no acute distress, obese HEENT: Hearing grossly normal, moist mucous membranes Respiratory system: CTA B with good air movement, no wheezes or rhonchi, normal respiratory effort at rest on  room air Cardiovascular system: Regular rate and rhythm, no peripheral edema Gastrointestinal system: Soft nontender abdomen Central nervous system: A&O x3. no gross focal neurologic deficits, normal speech Extremities: moves all, no edema, normal tone Skin: Wound VAC in place with sanguinous fluid in the canister Psychiatry: normal mood, congruent affect   Data Reviewed:  Notable labs: Leukocytosis improved 14.2 down from 16.8, hemoglobin stable 11.6.  CBGs at goal  Micro --abscess wound cultures with moderate gram-positive cocci, abundant Staph aureus --pending.  Blood cultures no growth at 2 days  Family Communication: Patient's sister updated on room phone during rounds today 7/16  Disposition: Status is: Inpatient Remains inpatient appropriate because: Severity of illness remaining on IV antibiotics pending culture results.  Initial wound VAC change planned for Monday per general surgery   Planned Discharge Destination: Home with Home Health    Time spent: 35 minutes  Author: Ezekiel Slocumb, DO 11/07/2021 1:13 PM  For on call review [email or web address].

## 2021-11-07 NOTE — Evaluation (Signed)
Physical Therapy Evaluation Patient Details Name: KHLOEY CHERN MRN: [id number] DOB: 1961-04-08 Today's Date: 11/07/2021  History of Present Illness  Pt is a 61 y/o F admitted on 11/04/21 after presenting to the ED with c/o gluteal abscess x 2 weeks. Pt is being treated for cellulitis of gluteal region. Pt underwent debridement on 11/05/21 & now has wound vac placed. PMH: chronic bronchitis, DM2 not on insulin, HTN, recurrent abscess  Clinical Impression  Pt seen for PT evaluation with pt agreeable to tx. Pt reports she lives alone in a level entry apartment & is independent without AD, denying falls in the past 6 months. On this date, pt is able to complete bed mobility independently (pt received in room lying prone in bed), STS & ambulation around unit without AD with independence. Pt does not demonstrate any acute PT needs at this time. PT encouraged pt to ambulate with staff while in hospital. PT to complete current orders at this time.  Gluteal wound vac intact throughout session.      Recommendations for follow up therapy are one component of a multi-disciplinary discharge planning process, led by the attending physician.  Recommendations may be updated based on patient status, additional functional criteria and insurance authorization.  Follow Up Recommendations No PT follow up      Assistance Recommended at Discharge PRN  Patient can return home with the following       Equipment Recommendations None recommended by PT  Recommendations for Other Services       Functional Status Assessment Patient has not had a recent decline in their functional status     Precautions / Restrictions Precautions Precautions: None Restrictions Weight Bearing Restrictions: No      Mobility  Bed Mobility Overal bed mobility: Independent                  Transfers Overall transfer level: Independent Equipment used: None               General transfer comment: STS without AD     Ambulation/Gait Ambulation/Gait assistance: Independent Gait Distance (Feet): 175 Feet Assistive device: None Gait Pattern/deviations: WFL(Within Functional Limits)          Stairs            Wheelchair Mobility    Modified Rankin (Stroke Patients Only)       Balance Overall balance assessment: Modified Independent   Sitting balance-Leahy Scale: Normal     Standing balance support: During functional activity, No upper extremity supported Standing balance-Leahy Scale: Good                               Pertinent Vitals/Pain Pain Assessment Pain Assessment: Faces Faces Pain Scale: Hurts a little bit Pain Location: wound vac location Pain Descriptors / Indicators: Discomfort Pain Intervention(s): Monitored during session    Home Living Family/patient expects to be discharged to:: Private residence Living Arrangements: Alone Available Help at Discharge: Family;Available PRN/intermittently Type of Home: Apartment Home Access: Level entry       Home Layout: One level        Prior Function Prior Level of Function : Independent/Modified Independent             Mobility Comments: Pt reports she lives alone & is independent with all mobility, has her license but hasn't driven in over a year (reports her car is in the shop & her family takes her places PRN).  Pt denies falls in the past 6 months.       Hand Dominance        Extremity/Trunk Assessment   Upper Extremity Assessment Upper Extremity Assessment: Overall WFL for tasks assessed (pt endorses numbness/tingling in L hand but feels this may be due to diabetic neuropathy; tremor observed in L hand at beginning of session)    Lower Extremity Assessment Lower Extremity Assessment: Overall WFL for tasks assessed       Communication   Communication: No difficulties  Cognition Arousal/Alertness: Awake/alert Behavior During Therapy: WFL for tasks assessed/performed Overall  Cognitive Status: Within Functional Limits for tasks assessed                                          General Comments      Exercises     Assessment/Plan    PT Assessment Patient does not need any further PT services  PT Problem List         PT Treatment Interventions      PT Goals (Current goals can be found in the Care Plan section)  Acute Rehab PT Goals Patient Stated Goal: get better, go home PT Goal Formulation: With patient Time For Goal Achievement: 11/21/21 Potential to Achieve Goals: Good    Frequency       Co-evaluation               AM-PAC PT "6 Clicks" Mobility  Outcome Measure Help needed turning from your back to your side while in a flat bed without using bedrails?: None Help needed moving from lying on your back to sitting on the side of a flat bed without using bedrails?: None Help needed moving to and from a bed to a chair (including a wheelchair)?: None Help needed standing up from a chair using your arms (e.g., wheelchair or bedside chair)?: None Help needed to walk in hospital room?: None Help needed climbing 3-5 steps with a railing? : None 6 Click Score: 24    End of Session   Activity Tolerance: Patient tolerated treatment well Patient left: with nursing/sitter in room (sitting EOB) Nurse Communication: Mobility status      Time: 3662-9476 PT Time Calculation (min) (ACUTE ONLY): 9 min   Charges:   PT Evaluation $PT Eval Low Complexity: 1 Low          Lavone Nian, PT, DPT 11/07/21, 12:24 PM   Waunita Schooner 11/07/2021, 12:23 PM

## 2021-11-08 LAB — GLUCOSE, CAPILLARY
Glucose-Capillary: 106 mg/dL — ABNORMAL HIGH (ref 70–99)
Glucose-Capillary: 107 mg/dL — ABNORMAL HIGH (ref 70–99)
Glucose-Capillary: 109 mg/dL — ABNORMAL HIGH (ref 70–99)
Glucose-Capillary: 115 mg/dL — ABNORMAL HIGH (ref 70–99)
Glucose-Capillary: 134 mg/dL — ABNORMAL HIGH (ref 70–99)

## 2021-11-08 LAB — CBC
HCT: 33.6 % — ABNORMAL LOW (ref 36.0–46.0)
Hemoglobin: 10.8 g/dL — ABNORMAL LOW (ref 12.0–15.0)
MCH: 28.6 pg (ref 26.0–34.0)
MCHC: 32.1 g/dL (ref 30.0–36.0)
MCV: 89.1 fL (ref 80.0–100.0)
Platelets: 336 10*3/uL (ref 150–400)
RBC: 3.77 MIL/uL — ABNORMAL LOW (ref 3.87–5.11)
RDW: 12.9 % (ref 11.5–15.5)
WBC: 10 10*3/uL (ref 4.0–10.5)
nRBC: 0 % (ref 0.0–0.2)

## 2021-11-08 MED ORDER — ACETAMINOPHEN 500 MG PO TABS: 1000.0000 mg | ORAL_TABLET | Freq: Four times a day (QID) | ORAL | 0 refills | Status: AC | PRN

## 2021-11-08 MED ORDER — OXYCODONE HCL 5 MG PO TABS: 5.0000 mg | ORAL_TABLET | ORAL | 0 refills | Status: DC | PRN

## 2021-11-08 MED ORDER — ATORVASTATIN CALCIUM 10 MG PO TABS
10.0000 mg | ORAL_TABLET | Freq: Every day | ORAL | Status: DC
  Administered 2021-11-08: 10 mg via ORAL
  Filled 2021-11-08: qty 1

## 2021-11-08 MED ORDER — CEFADROXIL 500 MG PO CAPS: 500.0000 mg | ORAL_CAPSULE | Freq: Two times a day (BID) | ORAL | 0 refills | Status: AC

## 2021-11-08 MED ORDER — CEFAZOLIN SODIUM-DEXTROSE 2-4 GM/100ML-% IV SOLN: 2.0000 g | Freq: Three times a day (TID) | INTRAVENOUS | Status: DC

## 2021-11-08 MED ORDER — ENOXAPARIN SODIUM 60 MG/0.6ML IJ SOSY: 0.5000 mg/kg | PREFILLED_SYRINGE | INTRAMUSCULAR | Status: DC

## 2021-11-08 NOTE — Progress Notes (Signed)
[place] Hospital Day(s): 4.   Post op day(s): 3 Days Post-Op.   Interval History:  Patient seen and examined No acute events or new complaints overnight.  Leukocytosis now resolved; 10.0K Cx growing staph aureus On cefepime and vancomycin  No issues with wound vac   Vital signs in last 24 hours: [min-max] current  Temp:  [98.4 F (36.9 C)] 98.4 F (36.9 C) (07/17 0508) Pulse Rate:  [54-79] 54 (07/17 0508) Resp:  [16-19] 19 (07/17 0508) BP: (96-107)/(61-80) 96/80 (07/17 0508) SpO2:  [99 %-100 %] 100 % (07/17 0508) Weight:  [102.9 kg] 102.9 kg (07/17 0500)     Height: '5\' 5"'$  (165.1 cm) Weight: 102.9 kg BMI (Calculated): 37.75   Intake/Output last 2 shifts:  07/16 0701 - 07/17 0700 In: 1070 [P.O.:720; IV Piggyback:350] Out: 0    Physical Exam:  Constitutional: alert, cooperative and no distress  Respiratory: breathing non-labored at rest  Cardiovascular: regular rate and sinus rhythm  Integumentary: 9 cm x 9.5 cm x 3 cm sacral wound; no evidence persistent infection, wound vac changed at bedside  Sacral wound (11/08/2021):     Labs:     Latest Ref Rng & Units 11/08/2021    4:07 AM 11/07/2021    6:17 AM 11/06/2021    5:29 AM  CBC  WBC 4.0 - 10.5 K/uL 10.0  14.2  16.8   Hemoglobin 12.0 - 15.0 g/dL 10.8  11.6  11.2   Hematocrit 36.0 - 46.0 % 33.6  35.6  34.7   Platelets 150 - 400 K/uL 336  321  304       Latest Ref Rng & Units 11/06/2021    5:29 AM 11/05/2021    6:29 AM 11/04/2021    4:15 PM  CMP  Glucose 70 - 99 mg/dL 201  176  253   BUN 8 - 23 mg/dL '18  13  12   '$ Creatinine 0.44 - 1.00 mg/dL 0.86  0.71  0.81   Sodium 135 - 145 mmol/L 135  136  132   Potassium 3.5 - 5.1 mmol/L 3.9  3.5  3.8   Chloride 98 - 111 mmol/L 109  107  98   CO2 22 - 32 mmol/L '20  23  23   '$ Calcium 8.9 - 10.3 mg/dL 8.3  8.6  9.2   Total Protein 6.5 - 8.1 g/dL  7.1  7.9   Total Bilirubin 0.3 - 1.2 mg/dL  0.6  0.6   Alkaline Phos 38 - 126 U/L  81   86   AST 15 - 41 U/L  32  31   ALT 0 - 44 U/L  29  26      Imaging studies: No new pertinent imaging studies   Assessment/Plan: 61 y.o. female 3 Days Post-Op s/p debridement and wound vac placement for unstageable sacral wound   - Continue wound vac changes MWF; appreciate WOC assistance. Establishing home health wound vac  - Continue IV Abx (Cefepime & Vancomycin); follow up Cx which ae growing staph aureus; pan-sensitive; narrow for home   - Pain control prn  - Monitor leukocytosis; resolved  - Further management per primary service  - Discharge Planning: Once home wound vac is established, she can be discharged from surgical perspective   All of the above findings and recommendations were discussed with the patient, and the medical team, and all of patient's questions were answered to her expressed satisfaction.  -- Edison Simon, PA-C [facility]  11/08/2021, 8:47 AM M-F: 7am - 4pm

## 2021-11-08 NOTE — Anesthesia Postprocedure Evaluation (Signed)
Anesthesia Post Note  Patient: Jamie Rodriguez  Procedure(s) Performed: DEBRIDMENT OF DECUBITUS ULCER  Patient location during evaluation: PACU Anesthesia Type: General Level of consciousness: awake and alert Pain management: pain level controlled Vital Signs Assessment: post-procedure vital signs reviewed and stable Respiratory status: spontaneous breathing, nonlabored ventilation, respiratory function stable and patient connected to nasal cannula oxygen Cardiovascular status: blood pressure returned to baseline and stable Postop Assessment: no apparent nausea or vomiting Anesthetic complications: no   No notable events documented.   Last Vitals:  Vitals:   11/07/21 2024 11/08/21 0508  BP: 107/61 96/80  Pulse: 79 (!) 54  Resp: 16 19  Temp: 36.9 C 36.9 C  SpO2: 99% 100%    Last Pain:  Vitals:   11/08/21 0900  TempSrc:   PainSc: 0-No pain                 Precious Haws Dacen Frayre

## 2021-11-08 NOTE — TOC Transition Note (Signed)
Transition of Care [facility]) - CM/SW Discharge Note   Patient Details  Name: Jamie Rodriguez MRN: [id number] Date of Birth: Apr 03, 1961  Transition of Care [facility]) CM/SW Contact:  Beverly Sessions, RN Phone Number: 11/08/2021, 2:46 PM   Clinical Narrative:     Patient to discharge today  Corene Cornea with [facility] notified of discharge  Vac to be delivered to room and placed prior to discharge  Patient states that she will arrange transport for discharge     Barriers to Discharge: Continued Medical Work up   Patient Goals and CMS Choice        Discharge Placement                       Discharge Plan and Services     Post Acute Care Choice: [place]          DME Arranged: Vac DME Agency: [employer] Date DME Agency Contacted: 11/07/21   Representative spoke with at DME Agency: Leontine Locket            Social Determinants of Health (SDOH) Interventions     Readmission Risk Interventions     No data to display

## 2021-11-08 NOTE — Progress Notes (Signed)
Pt has DC order, wound vac was changed to a portable woud vac. Supplies were sent with the pt. Education were given about the wound vac. AVS was given and explained to pt. Friend gave her a ride home.

## 2021-11-08 NOTE — Consult Note (Signed)
[facility] Nurse Consult Note: Patient receiving care in [facility]. Dr. Hampton Abbot at bedside for wound assessment and patient update. Reason for Consult: NPWT dressing change to sacral wound Wound type: surgical Pressure Injury POA: Yes/No/NA Measurement: 7.3 cm x 10.4 cm x 3.2 cm Wound bed: pink Drainage (amount, consistency, odor) small amount of serosanginous Periwound: intact Dressing procedure/placement/frequency: All black foam removed from wound bed. One piece placed into wound, bridged to right hip area. Patient did not require pre-medication, and was able to lie prone for dressing change process. Dressing in room for Wednesday.  Val Riles, RN, MSN, CWOCN, CNS-BC, pager [phone]

## 2021-11-08 NOTE — Care Management Important Message (Signed)
Important Message  Patient Details  Name: Jamie Rodriguez MRN: [id number] Date of Birth: 12-Aug-1960   Medicare Important Message Given:  Yes     Dannette Barbara 11/08/2021, 2:58 PM

## 2021-11-08 NOTE — TOC Progression Note (Signed)
Transition of Care [facility]) - Progression Note    Patient Details  Name: Jamie Rodriguez MRN: [id number] Date of Birth: [birth year]  Transition of Care [facility]) CM/SW Contact  Beverly Sessions, RN Phone Number: 11/08/2021, 1:34 PM  Clinical Narrative:     Per Olivia Mackie with [employer] wound vac will be delivered to room today  Corene Cornea with [facility] confirms they can accept referral with start of care on Wednesday   Expected Discharge Plan: [place] Barriers to Discharge: Continued Medical Work up  Expected Discharge Plan and Services Expected Discharge Plan: [place] Choice: [place] arrangements for the past 2 months: Apartment                 DME Arranged: Vac DME Agency: [employer] Date DME Agency Contacted: 11/07/21   Representative spoke with at DME Agency: Leontine Locket             Social Determinants of Health (SDOH) Interventions    Readmission Risk Interventions     No data to display

## 2021-11-08 NOTE — Discharge Summary (Signed)
Physician Discharge Summary   Patient: Jamie Rodriguez MRN: [id number] DOB: March 05, 1961  Admit date:     11/04/2021  Discharge date: 11/08/2021  Discharge Physician: Ezekiel Slocumb   PCP: Marguerita Merles, MD   Recommendations at discharge:   Follow-up with general surgery in 3 weeks Follow-up with primary care in 1 to 2 weeks Repeat BMP, CBC, Mg level in 1 to 2 weeks Follow-up with home health nursing for wound VAC management and close monitoring of wound healing Follow-up on blood pressure and resuming HCTZ if indicated  Discharge Diagnoses: Principal Problem:   Cellulitis of gluteal region Active Problems:   Sepsis ([place])   Type 2 diabetes mellitus ([place])   Pressure injury of sacral region, stage 3 (HCC)   Chronic bronchitis ([place])   Essential hypertension   Obesity (BMI 30-39.9)  Resolved Problems:   * No resolved hospital problems. [facility] Course: 61 y.o. female with medical history significant of  Chronic bronchitis, DMII not on insulin , HTN , recurrent abscess  who presented to ED on 11/04/2021 via EMS due to complaint of gluteal abscess progressive x 2 weeks.  Pt reported associated generalized body aches, headaches, increasing pain and drainage from the area.  Reported mild nausea, no emesis.  Denied any trauma to the area. Reported history of recurrent boils but notes usually 6 within a year.  She notes recently non-compliant with her long acting insulin the prior 2 weeks.  In the ED, febrile with temp 100.9 F, tachycardic HR 101.  Labs notable for Na 132, glucose 253, normal renal function, lactate 2.1, WBC 20k.  CT abdomen/pelvis showed extensive skin thickening and subcutaneous soft tissue swelling along the lower back consistent with cellulitis, no evidence of soft tissue gas or abscess, likely reactive left inguinal and external iliac lymph nodes.    Patient was admitted to the hospital, started on broad spectrum IV antibiotics.  General surgery  consulted.  Assessment and Plan: * Cellulitis of gluteal region General surgery consulted and took patient OR for debridement on 7/14.   Patient has wound VAC in place. -- General surgery follow-up -- Wound VAC changes MWF -- Home health nursing set up for assistance --Treated with empiric IV vancomycin and cefepime --Discharge on cefadroxil for 10 more days to complete 2-week course -- Pain control, antiemetics, antipyretics as needed  Sepsis [facility]) Patient met criteria for sepsis on admission with fever, tachycardia and leukocytosis in the setting of cellulitis. -- Manage cellulitis as outlined -- Monitor fever curve, CBC, hemodynamics closely -- Antibiotics as outlined for cellulitis  Type 2 diabetes mellitus ([place]) On admission, patient reported not compliant with Lantus for about 2 weeks.  Hyperglycemic with fasting sugar in the 250s on admission. Uncontrolled, hemoglobin A1c 12.6% -- Oral medications were held during admission -- Stop glipizide due to risk of hypoglycemia -- Resume metformin at d/c -- Continue basal insulin at bedtime -- PCP follow up  Obesity (BMI 30-39.9) Body mass index is 36.58 kg/m. Complicates overall care and prognosis.  Recommend lifestyle modifications including physical activity and diet for weight loss and overall long-term health.   Essential hypertension Antihypertensives held while NPO. BP remain normal and soft at times. HCTZ stopped to avoid causing hypotension. PCP follow up  Chronic bronchitis ([place]) No acute respiratory symptoms at time of admission.  Supportive care with mucolytic's and as needed bronchodilators  Pressure injury of sacral region, stage 3 ([place]) Present on admission.  I agree with the wound description as outlined.  Patient now has a wound VAC status postdebridement of cellulitis in this area. Wound VAC changes and wound care per general surgery recommendations.         Consultants: General surgery,  WOC Procedures performed: Sacral cellulitis debridement and wound vac placement.  Wound vac changed.   Disposition: Home health Diet recommendation:  Cardiac and Carb modified diet DISCHARGE MEDICATION: Allergies as of 11/08/2021       Reactions   Penicillins Rash        Medication List     STOP taking these medications    aspirin EC 81 MG tablet   citalopram 10 MG tablet Commonly known as: CELEXA   glipiZIDE 10 MG 24 hr tablet Commonly known as: GLUCOTROL XL   hydrochlorothiazide 25 MG tablet Commonly known as: HYDRODIURIL       TAKE these medications    acetaminophen 500 MG tablet Commonly known as: TYLENOL Take 2 tablets (1,000 mg total) by mouth every 6 (six) hours as needed for mild pain or fever.   atorvastatin 10 MG tablet Commonly known as: LIPITOR Take 10 mg by mouth daily.   cefadroxil 500 MG capsule Commonly known as: DURICEF Take 1 capsule (500 mg total) by mouth 2 (two) times daily for 10 days.   Lantus SoloStar 100 UNIT/ML Solostar Pen Generic drug: insulin glargine Inject 20 Units into the skin at bedtime.   liraglutide 18 MG/3ML Sopn Commonly known as: VICTOZA Inject 1.2 mg into the skin once a week.   meloxicam 15 MG tablet Commonly known as: MOBIC Take 15 mg by mouth daily.   metFORMIN 1000 MG tablet Commonly known as: GLUCOPHAGE Take 1,000 mg by mouth 2 (two) times daily.   oxyCODONE 5 MG immediate release tablet Commonly known as: Oxy IR/ROXICODONE Take 1 tablet (5 mg total) by mouth every 4 (four) hours as needed for moderate pain.               Discharge Care Instructions  (From admission, onward)           Start     Ordered   11/08/21 0000  Discharge wound care:       Comments: Continue wound vac, to be changed M/W/F with home health nursing.   11/08/21 1422            Discharge Exam: Filed Weights   11/05/21 1530 11/06/21 0637 11/08/21 0500  Weight: 99.7 kg 95.8 kg 102.9 kg   General exam: awake,  alert, no acute distress HEENT: atraumatic, clear conjunctiva, anicteric sclera, moist mucus membranes, hearing grossly normal  Respiratory system: CTAB, no wheezes, rales or rhonchi, normal respiratory effort. Cardiovascular system: normal S1/S2, RRR, no JVD, murmurs, rubs, gallops, no pedal edema.   Gastrointestinal system: soft, NT, ND, no HSM felt, +bowel sounds. Central nervous system: A&O x3. no gross focal neurologic deficits, normal speech Extremities: moves all, no edema, normal tone Skin: wound vac over sacram well-sealed with mininal serosanguinous fluid in canister Psychiatry: normal mood, congruent affect   Condition at discharge: stable  The results of significant diagnostics from this hospitalization (including imaging, microbiology, ancillary and laboratory) are listed below for reference.   Imaging Studies: CT ABDOMEN PELVIS W CONTRAST  Result Date: 11/04/2021 CLINICAL DATA:  Sepsis back abscess EXAM: CT ABDOMEN AND PELVIS WITH CONTRAST TECHNIQUE: Multidetector CT imaging of the abdomen and pelvis was performed using the standard protocol following bolus administration of intravenous contrast. RADIATION DOSE REDUCTION: This exam was performed according to the departmental dose-optimization program  which includes automated exposure control, adjustment of the mA and/or kV according to patient size and/or use of iterative reconstruction technique. CONTRAST:  165m OMNIPAQUE IOHEXOL 300 MG/ML  SOLN COMPARISON:  None Available. FINDINGS: Lower chest: No acute abnormality. Hepatobiliary: No focal liver abnormality is seen. Prior cholecystectomy. Pancreas: Unremarkable. No pancreatic ductal dilatation or surrounding inflammatory changes. Spleen: Normal in size without focal abnormality. Unchanged adjacent splenule. Adrenals/Urinary Tract: Adrenal glands are unremarkable. No hydronephrosis or nephrolithiasis. Tiny right renal cysts, too small to characterize. The bladder is moderately  distended. Stomach/Bowel: The stomach is within normal limits. There is no evidence of bowel obstruction.The appendix is normal. Vascular/Lymphatic: Aortoiliac atherosclerosis. No AAA. There is an enlarged left inguinal lymph node measuring up to 1.6 cm short axis (series 2, image 82). There is an enlarged left external iliac lymph node measuring up to 1.7 cm short axis (series 2, image 72). Reproductive: Unremarkable. Other: No abdominal wall hernia or abnormality. No abdominopelvic ascites. Musculoskeletal: There is no acute osseous abnormality. There are multilevel degenerative changes of the spine. Grade 1 listhesis at L4-L5, degenerative related to facet arthropathy. 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 There is a right-sided synovial herniation pit at the femoral head neck junction. There are no suspicious osseous lesions. There is extensive subcutaneous soft tissue swelling along the lower back. There is associated skin thickening. There is no focal fluid collection. There is no soft tissue gas. IMPRESSION: Extensive skin thickening and subcutaneous soft tissue swelling along the lower back, as can be seen in cellulitis. No soft tissue gas or evidence of soft tissue abscess. Enlarged left inguinal and external iliac lymph nodes, could be reactive. Recommend follow-up ultrasound in 3 months to ensure resolution. Electronically Signed   By: JMaurine SimmeringM.D.   On: 11/04/2021 18:03    Microbiology: Results for orders placed or performed during the hospital encounter of 11/04/21  Blood Culture (routine x 2)     Status: None   Collection Time: 11/04/21  4:15 PM   Specimen: BLOOD RIGHT HAND  Result Value Ref Range Status   Specimen Description BLOOD RIGHT HAND  Final   Special Requests   Final    BOTTLES DRAWN AEROBIC AND ANAEROBIC Blood Culture results may not be optimal due to an inadequate volume of blood received in culture bottles   Culture   Final    NO GROWTH 5 DAYS Performed at  [facility] [address] [place] [address]   Report Status 11/09/2021 FINAL  Final  Blood Culture (routine x 2)     Status: None   Collection Time: 11/04/21  4:15 PM   Specimen: BLOOD LEFT FOREARM  Result Value Ref Range Status   Specimen Description BLOOD LEFT FOREARM  Final   Special Requests   Final    BOTTLES DRAWN AEROBIC AND ANAEROBIC Blood Culture results may not be optimal due to an inadequate volume of blood received in culture bottles   Culture   Final    NO GROWTH 5 DAYS Performed at [facility] [address] [place] [address]   Report Status 11/09/2021 FINAL  Final  Aerobic/Anaerobic Culture w Gram Stain (surgical/deep wound)     Status: None   Collection Time: 11/04/21  4:15 PM   Specimen: Wound; Abscess  Result Value Ref Range Status   Specimen Description   Final    WOUND [facility] [address] [place]  [address]   Special  Requests   Final    NONE Performed at [facility], [place], [place] [address]    Gram Stain   Final    NO SQUAMOUS EPITHELIAL CELLS SEEN MODERATE WBC SEEN MODERATE GRAM POSITIVE COCCI    Culture   Final    ABUNDANT STAPHYLOCOCCUS AUREUS NO ANAEROBES ISOLATED Performed at [facility], [place] [address], [place], [place] [address]    Report Status 11/09/2021 FINAL  Final   Organism ID, Bacteria STAPHYLOCOCCUS AUREUS  Final      Susceptibility   Staphylococcus aureus - MIC*    CIPROFLOXACIN <=0.5 SENSITIVE Sensitive     ERYTHROMYCIN <=0.25 SENSITIVE Sensitive     GENTAMICIN <=0.5 SENSITIVE Sensitive     OXACILLIN <=0.25 SENSITIVE Sensitive     TETRACYCLINE <=1 SENSITIVE Sensitive     VANCOMYCIN 1 SENSITIVE Sensitive     TRIMETH/SULFA <=10 SENSITIVE Sensitive     CLINDAMYCIN <=0.25 SENSITIVE Sensitive     RIFAMPIN <=0.5 SENSITIVE Sensitive     Inducible Clindamycin NEGATIVE Sensitive     * ABUNDANT STAPHYLOCOCCUS AUREUS   MRSA Next Gen by PCR, Nasal     Status: None   Collection Time: 11/05/21  4:08 AM   Specimen: Nasal Mucosa; Nasal Swab  Result Value Ref Range Status   MRSA by PCR Next Gen NOT DETECTED NOT DETECTED Final    Comment: (NOTE) The GeneXpert MRSA Assay (FDA approved for NASAL specimens only), is one component of a comprehensive MRSA colonization surveillance program. It is not intended to diagnose MRSA infection nor to guide or monitor treatment for MRSA infections. Test performance is not FDA approved in patients less than 29 years old. Performed at [facility], [address] [place], [place] [address]   Aerobic/Anaerobic Culture w Gram Stain (surgical/deep wound)     Status: None   Collection Time: 11/05/21  2:02 PM   Specimen: PATH Other; Tissue  Result Value Ref Range Status   Specimen Description   Final    WOUND Performed at [facility], [address], [place], [place] [address]    Special Requests   Final    SACRAL ULCER Performed at [facility], [address] [place], [place] [address]    Gram Stain NO WBC SEEN RARE GRAM POSITIVE COCCI IN PAIRS   Final   Culture   Final    MODERATE STAPHYLOCOCCUS AUREUS NO ANAEROBES ISOLATED Performed at [facility], [place] [address] [place], [place] [address]    Report Status 11/10/2021 FINAL  Final   Organism ID, Bacteria STAPHYLOCOCCUS AUREUS  Final      Susceptibility   Staphylococcus aureus - MIC*    CIPROFLOXACIN <=0.5 SENSITIVE Sensitive     ERYTHROMYCIN <=0.25 SENSITIVE Sensitive     GENTAMICIN <=0.5 SENSITIVE Sensitive     OXACILLIN <=0.25 SENSITIVE Sensitive     TETRACYCLINE <=1 SENSITIVE Sensitive     VANCOMYCIN <=0.5 SENSITIVE Sensitive     TRIMETH/SULFA <=10 SENSITIVE Sensitive     CLINDAMYCIN <=0.25 SENSITIVE Sensitive     RIFAMPIN <=0.5 SENSITIVE Sensitive     Inducible Clindamycin NEGATIVE Sensitive     * MODERATE STAPHYLOCOCCUS AUREUS  Aerobic/Anaerobic Culture w Gram  Stain (surgical/deep wound)     Status: None   Collection Time: 11/05/21  2:04 PM   Specimen: PATH Other; Tissue  Result Value Ref Range Status   Specimen Description   Final    WOUND Performed at [facility], [address], [place], [place] [address]    Special Requests   Final    SACRAL ULCER Performed at [facility], [address] [place], [place] [address]    Gram Stain   Final    ABUNDANT WBC PRESENT,BOTH PMN AND MONONUCLEAR FEW GRAM POSITIVE COCCI IN PAIRS IN CLUSTERS    Culture   Final    MODERATE STAPHYLOCOCCUS AUREUS NO ANAEROBES ISOLATED Performed at [facility], [place] [address] [place], [place] [address]    Report Status 11/10/2021 FINAL  Final   Organism ID, Bacteria STAPHYLOCOCCUS AUREUS  Final      Susceptibility   Staphylococcus aureus - MIC*    CIPROFLOXACIN <=0.5 SENSITIVE Sensitive     ERYTHROMYCIN <=0.25 SENSITIVE Sensitive     GENTAMICIN <=0.5 SENSITIVE Sensitive     OXACILLIN <=0.25 SENSITIVE Sensitive     TETRACYCLINE <=1 SENSITIVE Sensitive     VANCOMYCIN 1 SENSITIVE Sensitive     TRIMETH/SULFA <=10 SENSITIVE Sensitive     CLINDAMYCIN <=0.25 SENSITIVE Sensitive     RIFAMPIN <=0.5 SENSITIVE Sensitive     Inducible Clindamycin NEGATIVE Sensitive     * MODERATE STAPHYLOCOCCUS AUREUS    Labs: CBC: No results for input(s): "WBC", "NEUTROABS", "HGB", "HCT", "MCV", "PLT" in the last 168 hours.  Basic Metabolic Panel: No results for input(s): "NA", "K", "CL", "CO2", "GLUCOSE", "BUN", "CREATININE", "CALCIUM", "MG", "PHOS" in the last 168 hours.  Liver Function Tests: No results for input(s): "AST", "ALT", "ALKPHOS", "BILITOT", "PROT", "ALBUMIN" in the last 168 hours.  CBG: No results for input(s): "GLUCAP" in the last 168 hours.   Discharge time spent: greater than 30 minutes.  Signed: Ezekiel Slocumb, DO [facility] 11/17/2021

## 2021-11-09 LAB — CULTURE, BLOOD (ROUTINE X 2)
Culture: NO GROWTH
Culture: NO GROWTH

## 2021-11-09 LAB — AEROBIC/ANAEROBIC CULTURE W GRAM STAIN (SURGICAL/DEEP WOUND): Gram Stain: NONE SEEN

## 2021-11-10 LAB — AEROBIC/ANAEROBIC CULTURE W GRAM STAIN (SURGICAL/DEEP WOUND): Gram Stain: NONE SEEN

## 2021-11-15 ENCOUNTER — Emergency Department
Admission: EM | Admit: 2021-11-15 | Discharge: 2021-11-15 | Disposition: A | Discharge status: Home / Self Care | Payer: Medicare Other | Attending: Emergency Medicine | Admitting: Emergency Medicine

## 2021-11-15 ENCOUNTER — Other Ambulatory Visit: Payer: Self-pay

## 2021-11-15 ENCOUNTER — Encounter: Payer: Self-pay | Admitting: Emergency Medicine

## 2021-11-15 DIAGNOSIS — L89109 Pressure ulcer of unspecified part of back, unspecified stage: Secondary | ICD-10-CM | POA: Diagnosis not present

## 2021-11-15 DIAGNOSIS — Z5189 Encounter for other specified aftercare: Secondary | ICD-10-CM | POA: Insufficient documentation

## 2021-11-15 DIAGNOSIS — Y838 Other surgical procedures as the cause of abnormal reaction of the patient, or of later complication, without mention of misadventure at the time of the procedure: Secondary | ICD-10-CM | POA: Insufficient documentation

## 2021-11-15 DIAGNOSIS — T85638A Leakage of other specified internal prosthetic devices, implants and grafts, initial encounter: Secondary | ICD-10-CM | POA: Diagnosis not present

## 2021-11-15 DIAGNOSIS — Z4801 Encounter for change or removal of surgical wound dressing: Secondary | ICD-10-CM | POA: Insufficient documentation

## 2021-11-15 DIAGNOSIS — Z4689 Encounter for fitting and adjustment of other specified devices: Secondary | ICD-10-CM

## 2021-11-15 MED ORDER — OXYCODONE HCL 5 MG PO TABS
5.0000 mg | ORAL_TABLET | Freq: Once | ORAL | Status: AC
  Administered 2021-11-15: 5 mg via ORAL
  Filled 2021-11-15: qty 1

## 2021-11-15 NOTE — TOC Initial Note (Signed)
Transition of Care [facility]) - Initial/Assessment Note    Patient Details  Name: Jamie Rodriguez MRN: [id number] Date of Birth: August 21, 1960  Transition of Care [facility]) CM/SW Contact:    Shelbie Hutching, RN Phone Number: 11/15/2021, 10:53 AM  Clinical Narrative:                 Patient to be discharged back home, wound vac dressing has been changed by Wound Ostomy RN. Patient reported that the wound vac was alarming and that the home health nurse could not come out today.   RNCM reached out to Br[place]ley with [facility], he called into the office, patient called the [facility] on call line this am at 2am.  They told her that they would come out and change the dressing today but they were not going to change it at 2 am.  Patient has been requesting to go to the wound clinic for dressing changes.  Home health is going to cont to see her for services MWF.  Patient does not need to go to the Wound Clinic for changes.          Patient Goals and CMS Choice        Expected Discharge Plan and Services                                                Prior Living Arrangements/Services                       Activities of Daily Living      Permission Sought/Granted                  Emotional Assessment              Admission diagnosis:  EMS Wound Vac Problem Patient Active Problem List   Diagnosis Date Noted   Sepsis ([place]) 11/05/2021   Type 2 diabetes mellitus ([place]) 11/05/2021   Chronic bronchitis ([place]) 11/05/2021   Essential hypertension 11/05/2021   Obesity (BMI 30-39.9) 11/05/2021   Sacral wound    Pressure injury of sacral region, stage 3 ([place])    Cellulitis of gluteal region 11/04/2021   PCP:  Marguerita Merles, MD Pharmacy:   [facility] [phone] [address], [place] [address] [place] [address] Phone: [phone] Fax: [phone]     Social Determinants of Health (SDOH)  Interventions    Readmission Risk Interventions     No data to display

## 2021-11-15 NOTE — ED Triage Notes (Signed)
Arrived via EMS from home. State patient had abscess drained from lumbar back 11/05/21 and was discharged 11/08/21. Patient reports wound vac had been alarming "leakage" then began alarming "blockage" and stopped working approximately 4 hours ago. Denies fever or any worsening symptoms. States home health nurse was unable to come to home for dressing change.

## 2021-11-15 NOTE — Consult Note (Signed)
[facility] Nurse Consult Note: Reason for Consult: VAC leaking, patient from home with [facility], contacted [facility] agency because of leaking/alarming. No HHRN to come to home so patient came to ED.  Wound type: pressure injury S/P debridement 11/05/21. Pressure Injury POA: Yes Measurement: 6cm x 8cm x 3.2cm  Wound bed: clean, pink, fibrinous material over wound bed but not slough  Drainage (amount, consistency, odor) moderate, serosanguinous  in VAC canister  Periwound: intact  Dressing procedure/placement/frequency: Removed old NPWT dressing Protected periwound skin with drape and bridged to the left hip (1pc of black foam for bridge) Filled wound with  1___ piece of black foam Sealed NPWT dressing at 182m HG Patient with major anxiety issues, trying to climb off bed as W[facility]urse is trying to replace dressing. She has multiple questions about technique of the [facility] also request that she come to the wound care center to have changed or come in for uKoreato change.  I have explained to her rationale for [facility] that this area is challenging to seal reguardless of who changes it.  Also with her up and about and using the toilet loosing a seal can be an issues. That if the [facility] not [facility], she can always take it off and put saline gauze dressing. She is all over the bed with me trying to gain a seal. Used 1/2 of a ostomy barrier ring at the apex of the gluteal cleft, would suggest HHRN use as well.  Sent the remaining 1/2 in the patient's home unit for use.   TOC and MD aware of situation.  Patient requesting "stronger" pain meds.  I have discussed pain management at home with patient and how to medicate prior to [place] Springs[place]rival.   FU with Dr. PHampton Abbotas scheduled Aug. 7th.   Patient to be DC back to home for NPWT dressing changes to [facility] with Dr. PHampton Abbotas scheduled. Notified TOC staff of need to [facility] on how to manage NPWT dressings/machine at home and teaching  patient what to do for future issues with NPWT device.      Re consult if needed, will not follow at this time. Thanks  Inesha Sow AR.R. [place] RN,CWOCN, CNS, [place]([phone]

## 2021-11-15 NOTE — ED Notes (Signed)
See triage note  Presents iwht some problems with wound vac  Thinks area is leaking  Some swelling noted to flank area

## 2021-11-15 NOTE — Discharge Instructions (Signed)
We have replaced her wound care dressing and you can follow-up with your wound care team for further work-up and management of

## 2021-11-15 NOTE — ED Provider Notes (Addendum)
[facility] Provider Note    Event Date/Time   First MD Initiated Contact with Patient 11/15/21 [phone]     (approximate)   History   Wound Vac Malfunction   HPI  Jamie Rodriguez is a 61 y.o. female who comes in for a wound vacuum issue.  I reviewed the notes and on 11/05/2021 patient underwent debridement of decubitus ulcer by Dr. Hampton Abbot.  Had a wound VAC placed at that time.  Patient reports that there has been alarmed that it is leaking and there was a blockage and is no longer draining.  She denies any worsening symptoms or fevers but the home health nurse was unable to come to her home in order to change it therefore patient came to the ER for wound VAC change.    Physical Exam   Triage Vital Signs: ED Triage Vitals  Enc Vitals Group     BP 11/15/21 0509 132/79     Pulse Rate 11/15/21 0509 68     Resp 11/15/21 0509 16     Temp 11/15/21 0509 98.6 F (37 C)     Temp Source 11/15/21 0509 Oral     SpO2 11/15/21 0509 99 %     Weight 11/15/21 0510 226 lb 13.7 oz (102.9 kg)     Height 11/15/21 0510 '5\' 5"'$  (1.651 m)     Head Circumference --      Peak Flow --      Pain Score 11/15/21 0510 6     Pain Loc --      Pain Edu? --      Excl. in [place]? --     Most recent vital signs: Vitals:   11/15/21 0509  BP: 132/79  Pulse: 68  Resp: 16  Temp: 98.6 F (37 C)  SpO2: 99%     General: Awake, no distress.  CV:  Good peripheral perfusion.  Resp:  Normal effort.  Abd:  No distention.  Other:  Patient has a wound VAC that is on her lower back with part of the dressing that is peeling off the side.  No redness or erythema noted around the wound   ED Results / Procedures / Treatments   Labs (all labs ordered are listed, but only abnormal results are displayed) Labs Reviewed - No data to display     PROCEDURES:  Critical Care performed: No  Procedures   MEDICATIONS ORDERED IN ED: Medications - No data to display   IMPRESSION / MDM /  [place] / ED COURSE  I reviewed the triage vital signs and the nursing notes.   Patient's presentation is most consistent with acute, uncomplicated illness.   Differential wound vac malfunction- pt otherwise well appearing no signs of sepsis or cellulitis based on exam.  D/w wound consult- director head nurse Maudie Mercury she does outpatient treatment therefore recommended talk to an inpatient wound care   Discussed with another provider from wound care is going to come down and try to change the dressing and will discharge patient after that  Patient has some discomfort after wound care dressing changes and oxycodone 5 mg at home so patient given a dose here.  New dressing had been placed and patient safe for discharge home     FINAL CLINICAL IMPRESSION(S) / ED DIAGNOSES   Final diagnoses:  Encounter for management of wound VAC     Rx / DC Orders   ED Discharge Orders     None  Note:  This document was prepared using Dragon voice recognition software and may include unintentional dictation errors.   Vanessa Dayton, MD 11/15/21 3295    Vanessa Dalton, MD 11/15/21 [phone]

## 2021-11-23 ENCOUNTER — Telehealth: Payer: Self-pay

## 2021-11-23 NOTE — Telephone Encounter (Signed)
Jamie Rodriguez with [facility] called and was asking for a verbal order for wound vac changes. Verbal order given. They will fax the order to be signed later today.

## 2021-11-29 ENCOUNTER — Ambulatory Visit (INDEPENDENT_AMBULATORY_CARE_PROVIDER_SITE_OTHER): Payer: Medicare Other | Admitting: Surgery

## 2021-11-29 ENCOUNTER — Other Ambulatory Visit: Payer: Self-pay | Admitting: Family Medicine

## 2021-11-29 ENCOUNTER — Encounter: Payer: Self-pay | Admitting: Surgery

## 2021-11-29 VITALS — BP 122/75 | HR 73 | Temp 98.9°F | Ht 65.0 in | Wt 203.0 lb

## 2021-11-29 DIAGNOSIS — R221 Localized swelling, mass and lump, neck: Secondary | ICD-10-CM

## 2021-11-29 DIAGNOSIS — Z09 Encounter for follow-up examination after completed treatment for conditions other than malignant neoplasm: Secondary | ICD-10-CM | POA: Diagnosis not present

## 2021-11-29 DIAGNOSIS — L89153 Pressure ulcer of sacral region, stage 3: Secondary | ICD-10-CM | POA: Diagnosis not present

## 2021-11-29 DIAGNOSIS — Z1231 Encounter for screening mammogram for malignant neoplasm of breast: Secondary | ICD-10-CM

## 2021-11-29 NOTE — Patient Instructions (Addendum)
You may take 2 extra strength Tylenol every 6 hours as needed.   Be sure to have the nurse make your sponge thinner/cut in half when doing the dressing changes because of how shallow your wound is.   Your next dressing change will be this Wednesday.   Take your Tylenol about 45 minutes before you have your dressing change if you can.   Follow up here in 1 month.

## 2021-11-29 NOTE — Progress Notes (Signed)
11/29/2021  HPI: Jamie Rodriguez is a 61 y.o. female s/p debridement of sacral wound on 11/05/21.  Patient presents for follow up.  She's been doing well, reports the wound is healing well without issues.  She reports that she also has a mass in the posterior neck and a small mass in the right wrist.  Vital signs: BP 122/75   Pulse 73   Temp 98.9 F (37.2 C)   Ht '5\' 5"'$  (1.651 m)   Wt 203 lb (92.1 kg)   SpO2 98%   BMI 33.78 kg/m    Physical Exam: Constitutional:  No acute distress Skin:  Sacral wound measure about 9 x 6 cm, but is very shallow, with healthy granulation tissue at the base.  No evidence of infection.  New wound vac placed. 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  It is soft, non-tender, though not greatly mobile. Wrist:  Patient has a small < 1 cm mass in the dorsal aspect of wrist, consistent with ganglion cyst.     Assessment/Plan: This is a 61 y.o. female s/p debridement of sacral wound.  --Wound is healing well, is filling in nicely and is very shallow now.  Very healthy granulation tissue present. --Wound vac dressing changed today at bedside.  Can continue with home health dressing changes MWF.  Since the wound is more shallow, can make the sponge less thick. --Follow up in a month, at which time we can discuss her lipoma more and schedule surgery.  Recommended that she contact orthopedic surgery for her ganglion cyst.   Melvyn Neth, [place]

## 2021-12-29 ENCOUNTER — Ambulatory Visit (INDEPENDENT_AMBULATORY_CARE_PROVIDER_SITE_OTHER): Payer: Medicare Other | Admitting: Surgery

## 2021-12-29 ENCOUNTER — Encounter: Payer: Self-pay | Admitting: Surgery

## 2021-12-29 VITALS — BP 152/83 | HR 101 | Temp 98.0°F | Ht 65.0 in | Wt 208.0 lb

## 2021-12-29 DIAGNOSIS — L89153 Pressure ulcer of sacral region, stage 3: Secondary | ICD-10-CM | POA: Diagnosis not present

## 2021-12-29 DIAGNOSIS — D170 Benign lipomatous neoplasm of skin and subcutaneous tissue of head, face and neck: Secondary | ICD-10-CM

## 2021-12-29 NOTE — Progress Notes (Signed)
12/29/2021  HPI: Jamie Rodriguez is a 61 y.o. female s/p debridement of sacral decubitus wound on 11/05/21.  She had a wound vac in place but that was recently discontinued as the wound has been healing well and is smaller now.  Denies any worsening pain.  She reports that now [facility] is coming less frequently.  Vital signs: BP (!) 152/83   Pulse (!) 101   Temp 98 F (36.7 C)   Ht '5\' 5"'$  (1.651 m)   Wt 208 lb (94.3 kg)   SpO2 97%   BMI 34.61 kg/m    Physical Exam: Constitutional: No acute distress Skin:  Sacral wound is healing well, with healthy granulation tissue, measuring about 4-5 cm in size, shallow depth.  Previous dressing had mucous type of fluid on the gauze.  Changed for new wet to dry dressing. Neck:  Patient also has a 5 x 2 cm lipoma in the posterior right neck, soft, somewhat mobile, non-tender.  Assessment/Plan: This is a 61 y.o. female s/p debridement of sacral decubitus wound.  --Patient is healing well.  Continue wet to dry dressings.  She should get at least 3 times weekly dressing changes. Discussed with patient that based on the [facility] schedule, she should contact us so we can see her in office for dressing change as needed to make sure it's done at least 3 times weekly. --She will follow up in a month.  In the meantime, she also reports that her glucose has been a bit elevated.  She sees endocrinology in two weeks.  Discussed that for a lipoma excision, although a small procedure, would like to see her glucose control being better in order to decrease risk of complications, particularly for an elective case.   Melvyn Neth, [facility]

## 2021-12-29 NOTE — Patient Instructions (Addendum)
You need to have the wet to dry dressing changes done 3 times a week.   If your home care cannot come three times a week please let us know and we can schedule you for a nurse visit here to do a dressing change as needed.   Follow up here in 1 month. At that time we will talk about having the lipoma on your neck removed.

## 2022-01-18 ENCOUNTER — Ambulatory Visit
Admission: RE | Admit: 2022-01-18 | Discharge: 2022-01-18 | Disposition: A | Discharge status: Home / Self Care | Payer: Medicare Other | Source: Ambulatory Visit | Attending: Family Medicine | Admitting: Family Medicine

## 2022-01-18 DIAGNOSIS — Z1231 Encounter for screening mammogram for malignant neoplasm of breast: Secondary | ICD-10-CM | POA: Insufficient documentation

## 2022-01-18 IMAGING — CT CT NECK W/ CM
4 of 6 series · 14 of 33 positions shown, 16 images · IV contrast (omnipaque)
Comparison: None.

CLINICAL DATA: Posterior neck swelling

EXAM:
CT NECK WITH CONTRAST
TECHNIQUE: Multidetector CT imaging of the neck was performed using the
standard protocol following the bolus administration of intravenous
contrast.
CONTRAST:  75mL OMNIPAQUE IOHEXOL 300 MG/ML  SOLN

[Series 5: axial bone neck 2.00 · axial · 0.61mm/px · z∈[-715,-643]mm · 2 of 110 slices shown]
[im 37/110  bone]
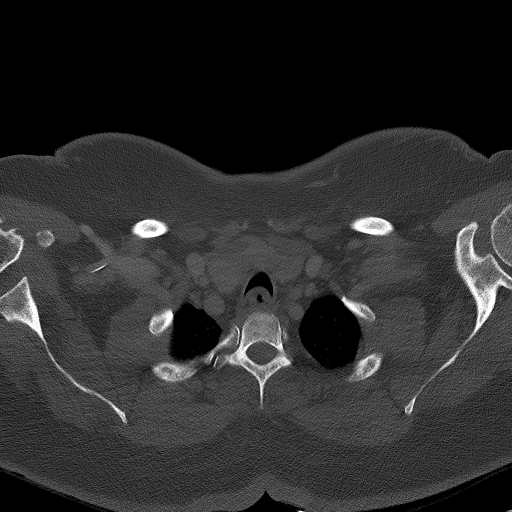
[im 73/110  bone]
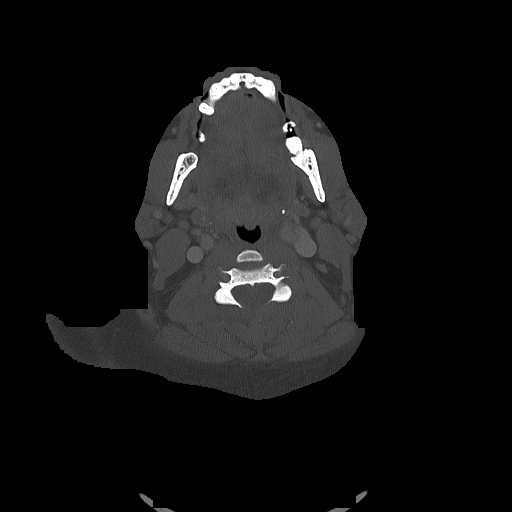

[Series 6: coronal neck neck (person_name) 2.00 cor · coronal · 0.60mm/px · 3 of 134 slices shown]
[im 43/134  bone]
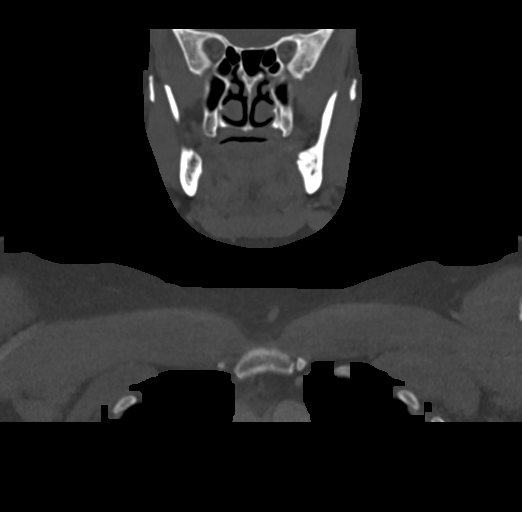
[im 59/134  bone]
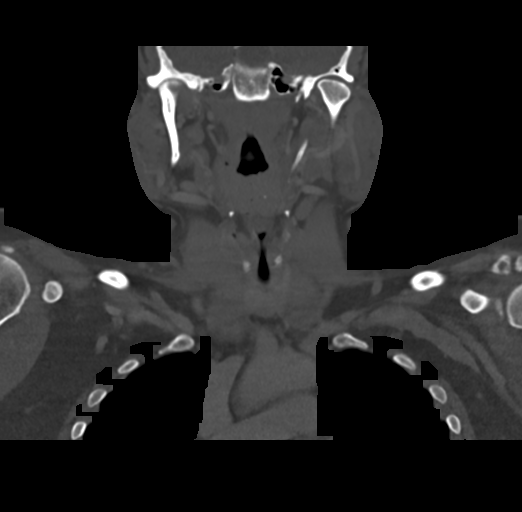
[im 75/134  bone]
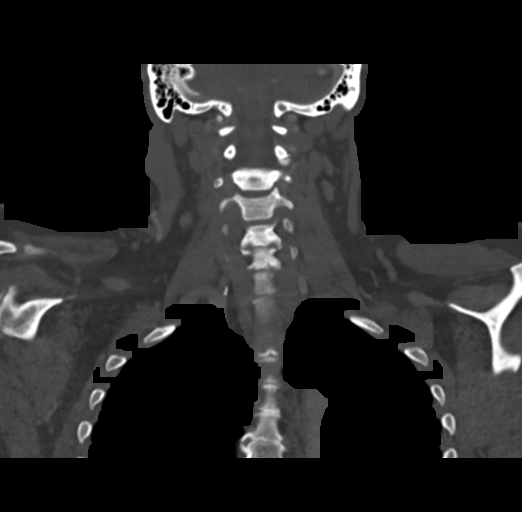

[Series 8: sagittal neck neck (person_name) 2.00 sag · sagittal · 0.53mm/px · 5 of 155 slices shown, 6 images]
[im 52/155  bone]
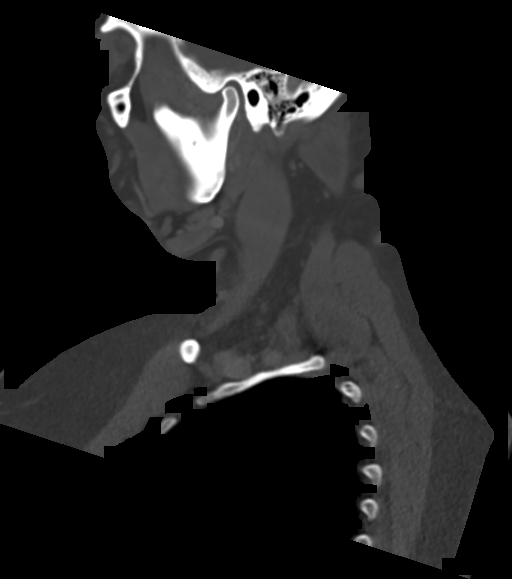
[im 65/155  bone]
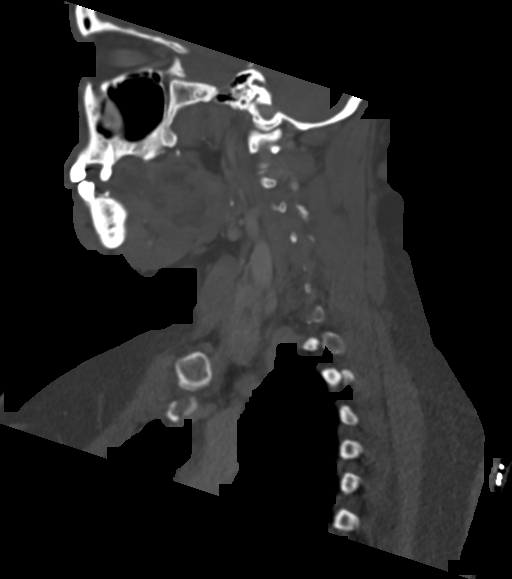
[im 78/155  soft-tissue]
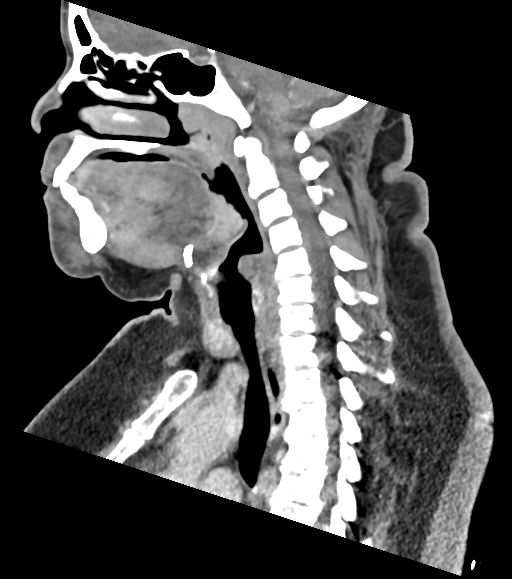
[im 78/155  bone]
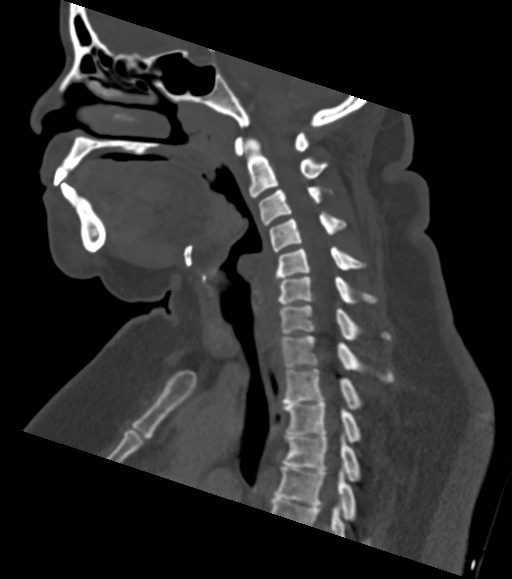
[im 90/155  bone]
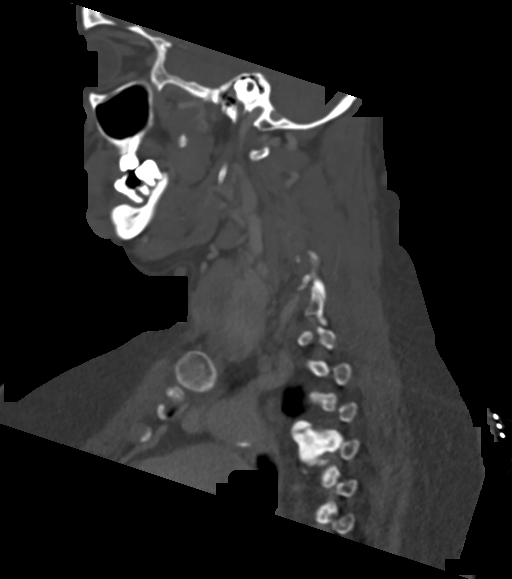
[im 103/155  bone]
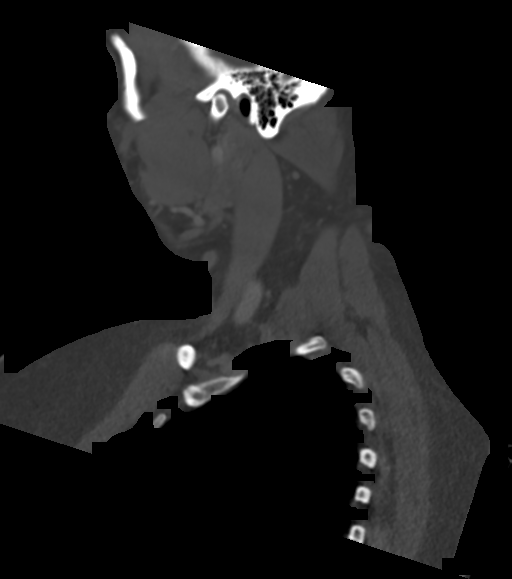

[Series 10: ax oropharynx neck neck (person_name) 2.00 ax · axial · 0.53mm/px · z∈[-809,-638]mm · 4 of 152 slices shown, 5 images]
[im 31/152  soft-tissue]
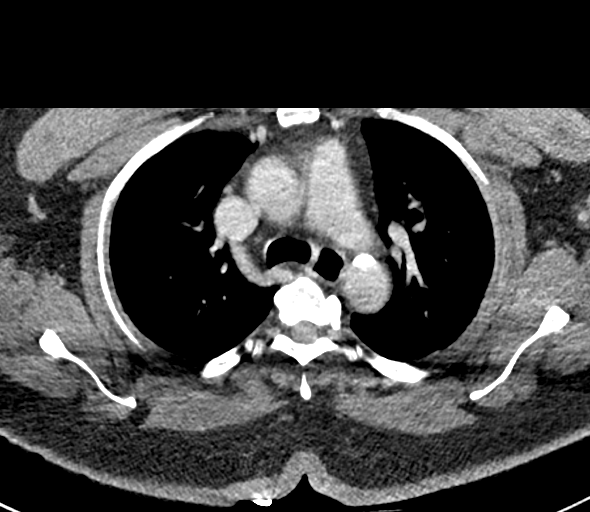
[im 31/152  bone]
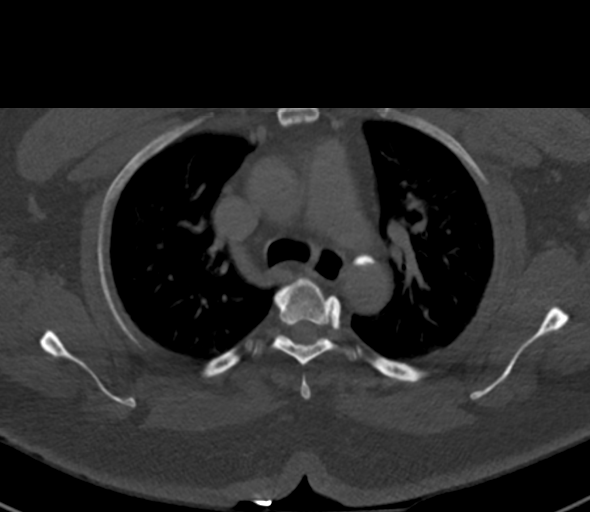
[im 61/152  bone]
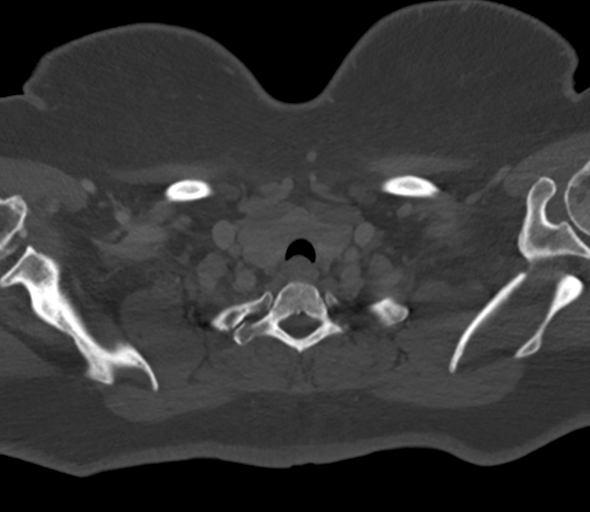
[im 91/152  bone]
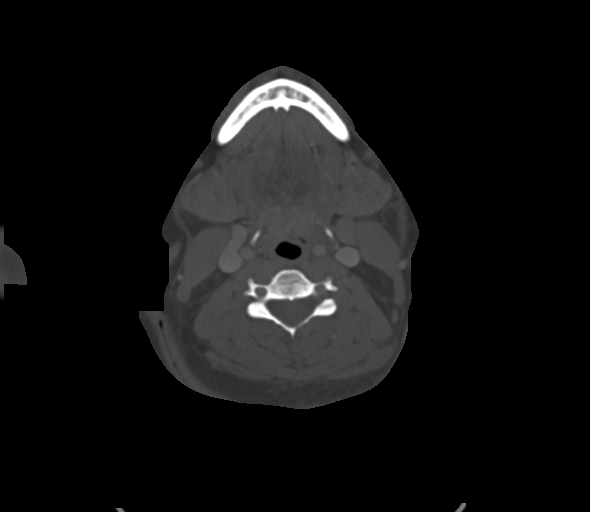
[im 121/152  bone]
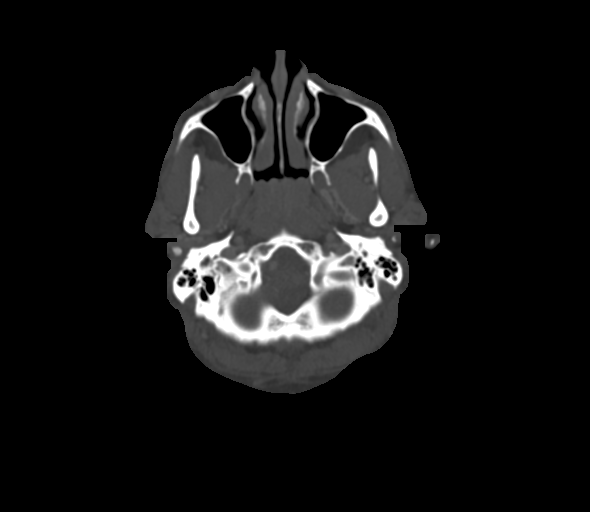

[14 of 33 positions shown; findings below may reference images not displayed]

FINDINGS: PHARYNX AND LARYNX: The nasopharynx, oropharynx and larynx are
normal. Visible portions of the oral cavity, tongue base and floor
of mouth are normal. Normal epiglottis, vallecula and pyriform
sinuses. The larynx is normal. No retropharyngeal abscess, effusion
or lymphadenopathy.

SALIVARY GLANDS: Normal parotid, submandibular and sublingual
glands.

THYROID: Normal.

LYMPH NODES: No enlarged or abnormal density lymph nodes.

VASCULAR: Major cervical vessels are patent.

LIMITED INTRACRANIAL: Normal.

VISUALIZED ORBITS: Normal.

MASTOIDS AND VISUALIZED PARANASAL SINUSES: No fluid levels or
advanced mucosal thickening. No mastoid effusion.

SKELETON: No bony spinal canal stenosis. No lytic or blastic
lesions.

UPPER CHEST: Clear.

OTHER: The site of the palpable abnormality at the posterior neck
skin surface was marked by the technologist prior to the scan using
a radiopaque marker. There is no focal abnormality underlying the
skin marker and no adjacent finding to explain the reported palpable
abnormality.

one.
IMPRESSION: Normal CT of the neck. No finding to explain the reported palpable
abnormality.

## 2022-01-28 ENCOUNTER — Encounter: Payer: Medicare Other | Admitting: Surgery

## 2022-02-09 ENCOUNTER — Encounter: Payer: Medicare Other | Admitting: Surgery

## 2022-02-14 ENCOUNTER — Ambulatory Visit (INDEPENDENT_AMBULATORY_CARE_PROVIDER_SITE_OTHER): Payer: Medicare Other | Admitting: Surgery

## 2022-02-14 ENCOUNTER — Encounter: Payer: Self-pay | Admitting: Surgery

## 2022-02-14 ENCOUNTER — Other Ambulatory Visit: Payer: Self-pay

## 2022-02-14 VITALS — BP 139/83 | HR 98 | Temp 99.1°F | Ht 66.0 in | Wt 204.0 lb

## 2022-02-14 DIAGNOSIS — L89153 Pressure ulcer of sacral region, stage 3: Secondary | ICD-10-CM

## 2022-02-14 DIAGNOSIS — D170 Benign lipomatous neoplasm of skin and subcutaneous tissue of head, face and neck: Secondary | ICD-10-CM

## 2022-02-14 NOTE — Progress Notes (Signed)
02/14/2022  History of Present Illness: Jamie Rodriguez is a 61 y.o. female presenting for follow-up of sacral decubitus wound and neck lipoma.  Patient is status post debridement of a sacral wound on 11/05/2021.  She is currently doing wet-to-dry dressing changes and reports that the wound continues to get smaller.  Denies any worsening pain, purulence, fevers, chills.  She is trying to get her glucose better controlled and she has just been added a new medication which she has not started yet.  Past Medical History: Past Medical History:  Diagnosis Date   Bronchitis    chronic   Diabetes mellitus without complication ([place])    Hypertension      Past Surgical History: Past Surgical History:  Procedure Laterality Date   CARPAL TUNNEL RELEASE Right    CHOLECYSTECTOMY     DEBRIDMENT OF DECUBITUS ULCER N/A 11/05/2021   Procedure: DEBRIDMENT OF DECUBITUS ULCER;  Surgeon: Olean Ree, MD;  Location: [facility] ORS;  Service: General;  Laterality: N/A;   TONSILLECTOMY     age 18 or 51    Home Medications: Prior to Admission medications   Medication Sig Start Date End Date Taking? Authorizing Provider  acetaminophen (TYLENOL) 500 MG tablet Take 2 tablets (1,000 mg total) by mouth every 6 (six) hours as needed for mild pain or fever. 11/08/21  Yes Nicole Kindred A, DO  atorvastatin (LIPITOR) 10 MG tablet Take 10 mg by mouth daily. 09/07/21  Yes [provider]  LANTUS SOLOSTAR 100 UNIT/ML Solostar Pen Inject 20 Units into the skin at bedtime. 09/07/21  Yes [provider]  liraglutide (VICTOZA) 18 MG/3ML SOPN Inject 1.2 mg into the skin once a week.    Yes [provider]  meloxicam (MOBIC) 15 MG tablet Take 15 mg by mouth daily. 12/16/19  Yes [provider]  metFORMIN (GLUCOPHAGE) 1000 MG tablet Take 1,000 mg by mouth 2 (two) times daily. 12/16/19  Yes [provider]    Allergies: Allergies  Allergen Reactions   Penicillins Rash    Review of  Systems: Review of Systems  Constitutional:  Negative for chills and fever.  Respiratory:  Negative for shortness of breath.   Cardiovascular:  Negative for chest pain.  Gastrointestinal:  Negative for abdominal pain, nausea and vomiting.  Skin:  Negative for rash.    Physical Exam BP 139/83   Pulse 98   Temp 99.1 F (37.3 C) (Oral)   Ht '5\' 6"'$  (1.676 m)   Wt 204 lb (92.5 kg)   SpO2 99%   BMI 32.93 kg/m  CONSTITUTIONAL: No acute distress HEENT:  Normocephalic, atraumatic, extraocular motion intact. RESPIRATORY:  Normal respiratory effort without pathologic use of accessory muscles. CARDIOVASCULAR: Regular rhythm and rate SKIN: The patient's sacral wound continues to heal well and is a lot smaller than it used to be.  The wound opening is now about 1 cm x 5 mm in size with a very shallow wound base.  There is no purulence or necrosis.  Silver nitrate was applied over the central portion of the wound and dressed with dry gauze dressing, ABD pad, and tape. NEUROLOGIC:  Motor and sensation is grossly normal.  Cranial nerves are grossly intact. PSYCH:  Alert and oriented to person, place and time. Affect is normal.  Assessment and Plan: This is a 61 y.o. female status post sacral decubitus wound debridement.  - The patient's wound continues to heal well.  Her diabetes does not appear to be well controlled yet she reports that  she had recently a glucose level when fasting in the low 200s.  Encouraged her to start the new medication that was given to her.  This will only help the healing process.  The wound is definitely smaller today and for now she does not need to continue with wet-to-dry dressing changes and can change now to a dry gauze dressing change.  This will continue to heal well.  Silver nitrate was applied. - Patient will follow-up in about 2 months see how she is doing with her diabetes to see if we can schedule her for the lipoma excision.  We will also do a wound check at the  same time. -Patient understands this plan of her questions have been answered.  I spent 20 minutes dedicated to the care of this patient on the date of this encounter to include pre-visit review of records, face-to-face time with the patient discussing diagnosis and management, and any post-visit coordination of care.   Melvyn Neth, [facility]

## 2022-02-14 NOTE — Patient Instructions (Addendum)
Continue with dry gauze dressing only. Apply ABD pad for padding and secure with tape. No more wet to dry dressing. We  applied silver Nitrate to the wound to help with healing. You will notice a dark grayish drainage. This is normal.   Please see your follow up appointment listed below.

## 2022-04-22 ENCOUNTER — Ambulatory Visit (INDEPENDENT_AMBULATORY_CARE_PROVIDER_SITE_OTHER): Payer: Medicare Other | Admitting: Surgery

## 2022-04-22 ENCOUNTER — Encounter: Payer: Self-pay | Admitting: Surgery

## 2022-04-22 ENCOUNTER — Other Ambulatory Visit: Payer: Self-pay

## 2022-04-22 ENCOUNTER — Telehealth: Payer: Self-pay

## 2022-04-22 VITALS — BP 147/91 | HR 98 | Temp 98.7°F | Ht 66.0 in | Wt 208.0 lb

## 2022-04-22 DIAGNOSIS — D170 Benign lipomatous neoplasm of skin and subcutaneous tissue of head, face and neck: Secondary | ICD-10-CM | POA: Diagnosis not present

## 2022-04-22 DIAGNOSIS — L89153 Pressure ulcer of sacral region, stage 3: Secondary | ICD-10-CM

## 2022-04-22 NOTE — Telephone Encounter (Signed)
Medical clearance faxed to Dr.Linda Lennox Grumbles.

## 2022-04-22 NOTE — H&P (View-Only) (Signed)
04/22/2022  History of Present Illness: Jamie Rodriguez is a 61 y.o. female status post debridement of a sacral wound on 11/05/2021.  She also has a posterior right neck lipoma.  Her last visit with me was on 02/14/2022.  The patient reports that her hemoglobin A1c has not been improving.  The last check that we have on chart on 03/07/2022 it was down to 9.1 from 12.6.  However the patient reports that she had a check at home recently and it was in the 7 range.  She reports that she has documentation of this at home.  She reports that the back wound is healing has healed well and has not been needing any dressing changes.  However she reports that it itches a lot.  Denies any new changes with the lipoma.  Past Medical History: Past Medical History:  Diagnosis Date   Bronchitis    chronic   Diabetes mellitus without complication ([place])    Hypertension      Past Surgical History: Past Surgical History:  Procedure Laterality Date   CARPAL TUNNEL RELEASE Right    CHOLECYSTECTOMY     DEBRIDMENT OF DECUBITUS ULCER N/A 11/05/2021   Procedure: DEBRIDMENT OF DECUBITUS ULCER;  Surgeon: Olean Ree, MD;  Location: [facility] ORS;  Service: General;  Laterality: N/A;   TONSILLECTOMY     age 37 or 30    Home Medications: Prior to Admission medications   Medication Sig Start Date End Date Taking? Authorizing Provider  acetaminophen (TYLENOL) 500 MG tablet Take 2 tablets (1,000 mg total) by mouth every 6 (six) hours as needed for mild pain or fever. 11/08/21  Yes Nicole Kindred A, DO  atorvastatin (LIPITOR) 10 MG tablet Take 10 mg by mouth daily. 09/07/21  Yes [provider]  LANTUS SOLOSTAR 100 UNIT/ML Solostar Pen Inject 20 Units into the skin at bedtime. 09/07/21  Yes [provider]  liraglutide (VICTOZA) 18 MG/3ML SOPN Inject 1.2 mg into the skin once a week.    Yes [provider]  meloxicam (MOBIC) 15 MG tablet Take 15 mg by mouth daily. 12/16/19  Yes [provider]  metFORMIN (GLUCOPHAGE) 1000 MG tablet Take 1,000 mg by mouth 2 (two) times daily. 12/16/19  Yes [provider]    Allergies: Allergies  Allergen Reactions   Penicillins Rash    Review of Systems: Review of Systems  Constitutional:  Negative for chills and fever.  Respiratory:  Negative for shortness of breath.   Cardiovascular:  Negative for chest pain.  Gastrointestinal:  Negative for abdominal pain, nausea and vomiting.  Genitourinary:  Negative for dysuria.  Skin:  Positive for itching. Negative for rash.    Physical Exam BP (!) 147/91   Pulse 98   Temp 98.7 F (37.1 C) (Oral)   Ht '5\' 6"'$  (1.676 m)   Wt 208 lb (94.3 kg)   SpO2 98%   BMI 33.57 kg/m  CONSTITUTIONAL: No acute distress, well-nourished HEENT:  Normocephalic, atraumatic, extraocular motion intact. RESPIRATORY:  Lungs are clear, and breath sounds are equal bilaterally. Normal respiratory effort without pathologic use of accessory muscles. CARDIOVASCULAR: Heart is regular without murmurs, gallops, or rubs. SKIN: Patient's superior sacral area has a wound that is fully healed now which had a scab which I was able to peel off with no complications.  The skin underneath it is fully healed without any open wounds left.  However there is a scaly dry.  In the posterior right neck, the patient has a  lipoma that is about 6 cm x 3 cm in size.  Overall appears stable without any tenderness, skin induration, or drainage. NEUROLOGIC:  Motor and sensation is grossly normal.  Cranial nerves are grossly intact. PSYCH:  Alert and oriented to person, place and time. Affect is normal.  Labs/Imaging: Hemoglobin A1c on 03/07/2022: 9.1  Assessment and Plan: This is a 61 y.o. female status post sacral wound debridement and also with a posterior right neck lipoma.  - The patient reports that her hemoglobin A1c checked at home was in the 7 range.  That would mean continued improvement from the last check a month and a half  ago.  I think in light of that, it would be appropriate now to discuss surgery for her lipoma.  The patient's sacral wound is now fully healed.  The skin is very scaly and dry and discussed with her that this is likely the reason for the itching.  Recommend that she start applying moisturizing lotion to the area to help. - With regards to the lipoma, discussed with the patient that we can proceed with excision of the posterior neck lipoma.  Given the size, I think it would be better to do this in the operating room.  She is in agreement.  Reviewed with her the procedure at length including the incision planned, the risks of bleeding, infection, injury to surrounding structures, that this would be an outpatient procedure, postoperative pain control, activity restrictions, and she is willing to proceed. - We will schedule her for surgery on 05/03/2022.  Will send for medical clearance as well.  I spent 30 minutes dedicated to the care of this patient on the date of this encounter to include pre-visit review of records, face-to-face time with the patient discussing diagnosis and management, and any post-visit coordination of care.   Melvyn Neth, [facility]

## 2022-04-22 NOTE — Progress Notes (Signed)
04/22/2022  History of Present Illness: Jamie Rodriguez is a 61 y.o. female status post debridement of a sacral wound on 11/05/2021.  She also has a posterior right neck lipoma.  Her last visit with me was on 02/14/2022.  The patient reports that her hemoglobin A1c has not been improving.  The last check that we have on chart on 03/07/2022 it was down to 9.1 from 12.6.  However the patient reports that she had a check at home recently and it was in the 7 range.  She reports that she has documentation of this at home.  She reports that the back wound is healing has healed well and has not been needing any dressing changes.  However she reports that it itches a lot.  Denies any new changes with the lipoma.  Past Medical History: Past Medical History:  Diagnosis Date   Bronchitis    chronic   Diabetes mellitus without complication ([place])    Hypertension      Past Surgical History: Past Surgical History:  Procedure Laterality Date   CARPAL TUNNEL RELEASE Right    CHOLECYSTECTOMY     DEBRIDMENT OF DECUBITUS ULCER N/A 11/05/2021   Procedure: DEBRIDMENT OF DECUBITUS ULCER;  Surgeon: Olean Ree, MD;  Location: [facility] ORS;  Service: General;  Laterality: N/A;   TONSILLECTOMY     age 8 or 71    Home Medications: Prior to Admission medications   Medication Sig Start Date End Date Taking? Authorizing Provider  acetaminophen (TYLENOL) 500 MG tablet Take 2 tablets (1,000 mg total) by mouth every 6 (six) hours as needed for mild pain or fever. 11/08/21  Yes Nicole Kindred A, DO  atorvastatin (LIPITOR) 10 MG tablet Take 10 mg by mouth daily. 09/07/21  Yes [provider]  LANTUS SOLOSTAR 100 UNIT/ML Solostar Pen Inject 20 Units into the skin at bedtime. 09/07/21  Yes [provider]  liraglutide (VICTOZA) 18 MG/3ML SOPN Inject 1.2 mg into the skin once a week.    Yes [provider]  meloxicam (MOBIC) 15 MG tablet Take 15 mg by mouth daily. 12/16/19  Yes [provider]  metFORMIN (GLUCOPHAGE) 1000 MG tablet Take 1,000 mg by mouth 2 (two) times daily. 12/16/19  Yes [provider]    Allergies: Allergies  Allergen Reactions   Penicillins Rash    Review of Systems: Review of Systems  Constitutional:  Negative for chills and fever.  Respiratory:  Negative for shortness of breath.   Cardiovascular:  Negative for chest pain.  Gastrointestinal:  Negative for abdominal pain, nausea and vomiting.  Genitourinary:  Negative for dysuria.  Skin:  Positive for itching. Negative for rash.    Physical Exam BP (!) 147/91   Pulse 98   Temp 98.7 F (37.1 C) (Oral)   Ht '5\' 6"'$  (1.676 m)   Wt 208 lb (94.3 kg)   SpO2 98%   BMI 33.57 kg/m  CONSTITUTIONAL: No acute distress, well-nourished HEENT:  Normocephalic, atraumatic, extraocular motion intact. RESPIRATORY:  Lungs are clear, and breath sounds are equal bilaterally. Normal respiratory effort without pathologic use of accessory muscles. CARDIOVASCULAR: Heart is regular without murmurs, gallops, or rubs. SKIN: Patient's superior sacral area has a wound that is fully healed now which had a scab which I was able to peel off with no complications.  The skin underneath it is fully healed without any open wounds left.  However there is a scaly dry.  In the posterior right neck, the patient has a  lipoma that is about 6 cm x 3 cm in size.  Overall appears stable without any tenderness, skin induration, or drainage. NEUROLOGIC:  Motor and sensation is grossly normal.  Cranial nerves are grossly intact. PSYCH:  Alert and oriented to person, place and time. Affect is normal.  Labs/Imaging: Hemoglobin A1c on 03/07/2022: 9.1  Assessment and Plan: This is a 61 y.o. female status post sacral wound debridement and also with a posterior right neck lipoma.  - The patient reports that her hemoglobin A1c checked at home was in the 7 range.  That would mean continued improvement from the last check a month and a half  ago.  I think in light of that, it would be appropriate now to discuss surgery for her lipoma.  The patient's sacral wound is now fully healed.  The skin is very scaly and dry and discussed with her that this is likely the reason for the itching.  Recommend that she start applying moisturizing lotion to the area to help. - With regards to the lipoma, discussed with the patient that we can proceed with excision of the posterior neck lipoma.  Given the size, I think it would be better to do this in the operating room.  She is in agreement.  Reviewed with her the procedure at length including the incision planned, the risks of bleeding, infection, injury to surrounding structures, that this would be an outpatient procedure, postoperative pain control, activity restrictions, and she is willing to proceed. - We will schedule her for surgery on 05/03/2022.  Will send for medical clearance as well.  I spent 30 minutes dedicated to the care of this patient on the date of this encounter to include pre-visit review of records, face-to-face time with the patient discussing diagnosis and management, and any post-visit coordination of care.   Melvyn Neth, [facility]

## 2022-04-22 NOTE — Patient Instructions (Addendum)
Apply moisturizing lotion to the area.   Our surgery scheduler will call you next week to confirm your surgery day. Please have the [facility] surgery sheet available when speaking with her.  Medical Clearance faxed to Dr.Linda Lennox Grumbles.  Lipoma Removal  Lipoma removal is a surgical procedure to remove a lipoma, which is a noncancerous (benign) tumor that is made up of fat cells. Most lipomas are small and painless and do not require treatment. They can form in many areas of the body but are most common under the skin of the back, arms, shoulders, buttocks, and thighs. You may need lipoma removal if you have a lipoma that is large, growing, or causing discomfort. Lipoma removal may also be done for cosmetic reasons. Tell a health care provider about: Any allergies you have. All medicines you are taking, including vitamins, herbs, eye drops, creams, and over-the-counter medicines. Any problems you or family members have had with anesthetic medicines. Any bleeding problems you have. Any surgeries you have had. Any medical conditions you have. Whether you are pregnant or may be pregnant. What are the risks? Generally, this is a safe procedure. However, problems may occur, including: Infection. Bleeding. Scarring. Allergic reactions to medicines. Damage to nearby structures or organs, such as damage to nerves or blood vessels near the lipoma. What happens before the procedure? When to Stop Eating and Drinking Follow instructions from your health care provider about what you may eat and drink before your procedure. These may include: 8 hours before your procedure Stop eating most foods. Do not eat meat, fried foods, or fatty foods. Eat only light foods, such as toast or crackers. All liquids are okay except energy drinks and alcohol. 6 hours before your procedure Stop eating. Drink only clear liquids, such as water, clear fruit juice, black coffee, plain tea, and sports drinks. Do not drink  energy drinks or alcohol. 2 hours before your procedure Stop drinking all liquids. You may be allowed to take medicines with small sips of water. If you do not follow your health care provider's instructions, your procedure may be delayed or canceled. Medicines Ask your health care provider about: Changing or stopping your regular medicines. This is especially important if you are taking diabetes medicines or blood thinners. Taking medicines such as aspirin and ibuprofen. These medicines can thin your blood. Do not take these medicines unless your health care provider tells you to take them. Taking over-the-counter medicines, vitamins, herbs, and supplements. General instructions You will have a physical exam. Your health care provider will check the size of the lipoma and whether it can be removed easily. You may have a biopsy and imaging tests, such as X-rays, a CT scan, and an MRI. Do not use any products that contain nicotine or tobacco for at least 4 weeks before the procedure. These products include cigarettes, chewing tobacco, and vaping devices, such as e-cigarettes. If you need help quitting, ask your health care provider. Ask your health care provider: How your surgery site will be marked. What steps will be taken to help prevent infection. These may include: Washing skin with a germ-killing soap. Taking antibiotic medicine. If you will be going home right after the procedure, plan to have a responsible adult: Take you home from the hospital or clinic. You will not be allowed to drive. Care for you for the time you are told. What happens during the procedure?  An IV will be inserted into one of your veins. You will be given one or  more of the following: A medicine to help you relax (sedative). A medicine to numb the area (local anesthetic). A medicine to make you fall asleep (general anesthetic). A medicine that is injected into an area of your body to numb everything below the  injection site (regional anesthetic). An incision will be made into the skin over the lipoma or very near the lipoma. The incision may be made in a natural skin line or crease. Tissues, nerves, and blood vessels near the lipoma will be moved out of the way. The lipoma and the capsule that surrounds it will be separated from the surrounding tissues. The lipoma will be removed. The incision may be closed with stitches (sutures). A bandage (dressing) will be placed over the incision. The procedure may vary among health care providers and hospitals. What happens after the procedure? Your blood pressure, heart rate, breathing rate, and blood oxygen level will be monitored until you leave the hospital or clinic. If you were prescribed an antibiotic medicine, use it as told by your health care provider. Do not stop using the antibiotic even if you start to feel better. If you were given a sedative during the procedure, it can affect you for several hours. Do not drive or operate machinery until your health care provider says that it is safe. Where to find more information OrthoInfo: orthoinfo.aaos.org Summary Before the procedure, follow instructions from your health care provider about eating and drinking, and changing or stopping your regular medicines. This is especially important if you are taking diabetes medicines or blood thinners. After the lipoma is removed, the incision may be closed with stitches (sutures) and covered with a bandage (dressing). If you were given a sedative during the procedure, it can affect you for several hours. Do not drive or operate machinery until your health care provider says that it is safe. This information is not intended to replace advice given to you by your health care provider. Make sure you discuss any questions you have with your health care provider. Document Revised: 04/30/2021 Document Reviewed: 04/30/2021 Elsevier Patient Education  [place].

## 2022-04-26 ENCOUNTER — Telehealth: Payer: Self-pay | Admitting: Surgery

## 2022-04-26 NOTE — Telephone Encounter (Signed)
Patient has been advised of Pre-Admission date/time, and Surgery date at [facility]  Surgery Date: 05/03/22 Preadmission Testing Date: 04/28/22 (phone 8a-1p)  Patient has been made aware to call [phone], between 1-3:00pm the day before surgery, to find out what time to arrive for surgery.

## 2022-04-28 ENCOUNTER — Other Ambulatory Visit: Payer: Self-pay

## 2022-04-28 ENCOUNTER — Encounter
Admission: RE | Admit: 2022-04-28 | Discharge: 2022-04-28 | Disposition: A | Discharge status: Home / Self Care | Payer: Medicare Other | Source: Ambulatory Visit | Attending: Surgery | Admitting: Surgery

## 2022-04-28 VITALS — Ht 65.0 in | Wt 208.0 lb

## 2022-04-28 DIAGNOSIS — I10 Essential (primary) hypertension: Secondary | ICD-10-CM

## 2022-04-28 DIAGNOSIS — E669 Obesity, unspecified: Secondary | ICD-10-CM

## 2022-04-28 HISTORY — DX: Unspecified chronic bronchitis: J42

## 2022-04-28 HISTORY — DX: Anxiety disorder, unspecified: F41.9

## 2022-04-28 NOTE — Patient Instructions (Addendum)
Your procedure is scheduled on:  Report to [facility]. To find out your arrival time please call [phone] between 1PM - 3PM on .  Remember: Instructions that are not followed completely may result in serious medical risk, up to and including death, or upon the discretion of your surgeon and anesthesiologist your surgery may need to be rescheduled.     _X__ 1. Do not eat food after midnight the night before your procedure.                 No gum chewing or hard candies. You may drink clear liquids up to 2 hours                 before you are scheduled to arrive for your surgery- DO not drink clear                 liquids within 2 hours of the start of your surgery.                 Clear Liquids include:  water, apple juice without pulp, clear carbohydrate                 drink such as Clearfast or Gatorade, Black Coffee or Tea (Do not add                 anything to coffee or tea). Diabetics water only  __X__2.  On the morning of surgery brush your teeth with toothpaste and water, you                 may rinse your mouth with mouthwash if you wish.  Do not swallow any              toothpaste of mouthwash.     _X__ 3.  No Alcohol for 24 hours before or after surgery.   _X__ 4.  Do Not Smoke or use e-cigarettes For 24 Hours Prior to Your Surgery.                 Do not use any chewable tobacco products for at least 6 hours prior to                 surgery.  ____  5.  Bring all medications with you on the day of surgery if instructed.   __X__  6.  Notify your doctor if there is any change in your medical condition      (cold, fever, infections).     Do not wear jewelry, make-up, hairpins, clips or nail polish. Do not wear lotions, powders, or perfumes.  Do not shave 48 hours prior to surgery. Men may shave face and neck. Do not bring valuables to the hospital.    [facility] is not responsible for any belongings or  valuables.  Contacts, dentures/partials or body piercings may not be worn into surgery. Bring a case for your contacts, glasses or hearing aids, a denture cup will be supplied. Leave your suitcase in the car. After surgery it may be brought to your room. For patients admitted to the hospital, discharge time is determined by your treatment team.   Patients discharged the day of surgery will not be allowed to drive home.    __X__ Take these medicines the morning of surgery with A SIP OF WATER:    1. atorvastatin (LIPITOR) 10 MG tablet   2.   3.  4.  5.  6.  ____ Fleet Enema (as directed)   ____ Use CHG Soap/SAGE wipes as directed  ____ Use inhalers on the day of surgery  ____ Stop metformin/Janumet/Farxiga 2 days prior to surgery    ____ Take 1/2 of usual insulin dose the night before surgery. No insulin the morning          of surgery.   ____ Stop Blood Thinners Coumadin/Plavix/Xarelto/Pleta/Pradaxa/Eliquis/Effient/Aspirin  on   Or contact your Surgeon, Cardiologist or Medical Doctor regarding  ability to stop your blood thinners  __X__ Stop Anti-inflammatories 7 days before surgery such as Advil, Ibuprofen, Motrin,  BC or Goodies Powder, Naprosyn, Naproxen, Aleve, Aspirin   Stop Meloxicam today  __X__ Stop all herbals and supplements, fish oil or vitamins  until after surgery.    ____ Bring C-Pap to the hospital.

## 2022-05-02 ENCOUNTER — Encounter: Payer: Self-pay | Admitting: Urgent Care

## 2022-05-02 NOTE — Progress Notes (Unsigned)
Medical Clearance has been received from Dr Lennox Grumbles. The patient is cleared at Low risk for surgery.

## 2022-05-03 ENCOUNTER — Encounter
Admission: RE | Disposition: A | Discharge status: Home / Self Care | Payer: Self-pay | Source: Home / Self Care | Attending: Surgery

## 2022-05-03 ENCOUNTER — Ambulatory Visit
Admission: RE | Admit: 2022-05-03 | Discharge: 2022-05-03 | Disposition: A | Discharge status: Home / Self Care | Payer: 59 | Attending: Surgery | Admitting: Surgery

## 2022-05-03 DIAGNOSIS — D170 Benign lipomatous neoplasm of skin and subcutaneous tissue of head, face and neck: Secondary | ICD-10-CM | POA: Diagnosis present

## 2022-05-03 DIAGNOSIS — Z538 Procedure and treatment not carried out for other reasons: Secondary | ICD-10-CM | POA: Insufficient documentation

## 2022-05-03 LAB — GLUCOSE, CAPILLARY: Glucose-Capillary: 134 mg/dL — ABNORMAL HIGH (ref 70–99)

## 2022-05-03 SURGERY — EXCISION LIPOMA
Anesthesia: General

## 2022-05-03 SURGICAL SUPPLY — 33 items
ADH SKN CLS APL DERMABOND .7 (GAUZE/BANDAGES/DRESSINGS) ×1
APL PRP STRL LF DISP 70% ISPRP (MISCELLANEOUS) ×1
CHLORAPREP W/TINT 26 (MISCELLANEOUS) ×2 IMPLANT
DERMABOND ADVANCED .7 DNX12 (GAUZE/BANDAGES/DRESSINGS) ×2 IMPLANT
DRAPE 3/4 80X56 (DRAPES) ×4 IMPLANT
DRAPE LAPAROTOMY 100X77 ABD (DRAPES) ×2 IMPLANT
ELECT CAUTERY BLADE TIP 2.5 (TIP) ×1
ELECT REM PT RETURN 9FT ADLT (ELECTROSURGICAL) ×1
ELECTRODE CAUTERY BLDE TIP 2.5 (TIP) ×2 IMPLANT
ELECTRODE REM PT RTRN 9FT ADLT (ELECTROSURGICAL) ×2 IMPLANT
GAUZE 4X4 16PLY ~~LOC~~+RFID DBL (SPONGE) ×2 IMPLANT
GLOVE SURG SYN 7.0 (GLOVE) ×1 IMPLANT
GLOVE SURG SYN 7.0 PF PI (GLOVE) ×1 IMPLANT
GLOVE SURG SYN 7.5  E (GLOVE) ×1
GLOVE SURG SYN 7.5 E (GLOVE) ×1 IMPLANT
GLOVE SURG SYN 7.5 PF PI (GLOVE) ×1 IMPLANT
GOWN STRL REUS W/ TWL LRG LVL3 (GOWN DISPOSABLE) ×4 IMPLANT
GOWN STRL REUS W/TWL LRG LVL3 (GOWN DISPOSABLE) ×2
KIT TURNOVER KIT A (KITS) ×2 IMPLANT
LABEL OR SOLS (LABEL) ×2 IMPLANT
MANIFOLD NEPTUNE II (INSTRUMENTS) ×2 IMPLANT
NEEDLE HYPO 22GX1.5 SAFETY (NEEDLE) ×2 IMPLANT
NS IRRIG 1000ML POUR BTL (IV SOLUTION) ×2 IMPLANT
PACK BASIN MINOR ARMC (MISCELLANEOUS) ×2 IMPLANT
SUT MNCRL 4-0 (SUTURE) ×1
SUT MNCRL 4-0 27XMFL (SUTURE) ×1
SUT VIC AB 0 SH 27 (SUTURE) ×4 IMPLANT
SUT VIC AB 3-0 SH 27 (SUTURE) ×2
SUT VIC AB 3-0 SH 27X BRD (SUTURE) ×4 IMPLANT
SUTURE MNCRL 4-0 27XMF (SUTURE) ×2 IMPLANT
SYR 30ML LL (SYRINGE) ×2 IMPLANT
TRAP FLUID SMOKE EVACUATOR (MISCELLANEOUS) ×2 IMPLANT
WATER STERILE IRR 500ML POUR (IV SOLUTION) ×2 IMPLANT

## 2022-05-03 NOTE — H&P (Signed)
05/03/22  Patietn presented today for surgery, but there is no one that can take her home or be with her at home tonight.  As such, we will postpone her surgery.  Patient will be discharged from preop.  Jamie Ree, MD

## 2022-05-04 ENCOUNTER — Telehealth: Payer: Self-pay | Admitting: Surgery

## 2022-05-04 NOTE — Telephone Encounter (Signed)
Updated information regarding rescheduled surgery.    Patient has been advised of Pre-Admission date/time, and Surgery date at [facility].  Surgery Date: 05/17/22 Preadmission Testing Date: 05/09/22 (phone 1p-5p)  Patient has been made aware to call [phone], between 1-3:00pm the day before surgery, to find out what time to arrive for surgery.

## 2022-05-09 ENCOUNTER — Inpatient Hospital Stay
Admission: RE | Admit: 2022-05-09 | Discharge: 2022-05-09 | Disposition: A | Discharge status: Home / Self Care | Payer: Medicare Other | Source: Ambulatory Visit

## 2022-05-09 NOTE — Pre-Procedure Instructions (Signed)
PAT call was done 04/28/22, but surgery was cancelled 05/03/22 due to patient not having a person with her after the surgery. Surgery was rescheduled to 05/17/22. Attempted to call several times but no answer, LVM to have patient call me back.

## 2022-05-10 ENCOUNTER — Encounter
Admission: RE | Admit: 2022-05-10 | Discharge: 2022-05-10 | Disposition: A | Discharge status: Home / Self Care | Payer: 59 | Source: Ambulatory Visit | Attending: Surgery | Admitting: Surgery

## 2022-05-10 NOTE — Patient Instructions (Signed)
Your procedure is scheduled on: Tuesday May 17, 2022. Report to Day Surgery inside [facility] 2nd floor, stop by registration desk before getting on elevator.  To find out your arrival time please call [phone] between 1PM - 3PM on Monday May 16, 2022.  Remember: Instructions that are not followed completely may result in serious medical risk,  up to and including death, or upon the discretion of your surgeon and anesthesiologist your  surgery may need to be rescheduled.     _X__ 1. Do not eat food after midnight the night before your procedure.                 No chewing gum or hard candies. You may drink clear liquids up to 2 hours                 before you are scheduled to arrive for your surgery- DO not drink clear                 liquids within 2 hours of the start of your surgery.                 Clear Liquids include:  water.  __X__2.  On the morning of surgery brush your teeth with toothpaste and water, you                may rinse your mouth with mouthwash if you wish.  Do not swallow any toothpaste or mouthwash.     _X__ 3.  No Alcohol for 24 hours before or after surgery.   _X__ 4.  Do Not Smoke or use e-cigarettes For 24 Hours Prior to Your Surgery.                 Do not use any chewable tobacco products for at least 6 hours prior to                 Surgery.  _X__  5.  Do not use any recreational drugs (marijuana, cocaine, heroin, ecstasy, MDMA or other)                For at least one week prior to your surgery.  Combination of these drugs with anesthesia                May have life threatening results.  ____  6.  Bring all medications with you on the day of surgery if instructed.   __X__  7.  Notify your doctor if there is any change in your medical condition      (cold, fever, infections).     Do not wear jewelry, make-up, hairpins, clips or nail polish. Do not wear lotions, powders, or perfumes. You may wear deodorant. Do not  shave 48 hours prior to surgery. Men may shave face and neck. Do not bring valuables to the hospital.    [facility] is not responsible for any belongings or valuables.  Contacts, dentures or bridgework may not be worn into surgery. Leave your suitcase in the car. After surgery it may be brought to your room. For patients admitted to the hospital, discharge time is determined by your treatment team.   Patients discharged the day of surgery will not be allowed to drive home.   Make arrangements for someone to be with you for the first 24 hours of your Same Day Discharge.   __X__ Take these medicines the morning of surgery with A SIP OF WATER:  1. atorvastatin (LIPITOR) 10 MG   2.   3.   4.  5.  6.  ____ Fleet Enema (as directed)   __X__ Use CHG Soap (or wipes) as directed  ____ Use Benzoyl Peroxide Gel as instructed  ____ Use inhalers on the day of surgery  __X__ Stop  metFORMIN (GLUCOPHAGE) 1000 MG 2 days prior to surgery (take last dose Saturday 05/14/22)   __X__ Take 1/2 of usual insulin dose the night before surgery. LANTUS SOLOSTAR 100 UNIT/ML Solostar Pen  NO insulin the morning of surgery.   ____ Call your PCP, cardiologist, or Pulmonologist if taking Coumadin/Plavix/aspirin and ask when to stop before your surgery.   __X__ One Week prior to surgery- Stop Anti-inflammatories such as Ibuprofen, Aleve, Advil, Motrin, meloxicam (MOBIC), diclofenac, etodolac, ketorolac, Toradol, Daypro, piroxicam, Goody's or BC powders. OK TO USE TYLENOL IF NEEDED   __X__ Stop supplements until after surgery.    ____ Bring C-Pap to the hospital.    If you have any questions regarding your pre-procedure instructions,  Please call Pre-admit Testing at [phone]    Preparing for Surgery with CHLORHEXIDINE GLUCONATE (CHG) Soap  Chlorhexidine Gluconate (CHG) Soap  o An antiseptic cleaner that kills germs and bonds with the skin to continue killing germs even after washing  o  Used for showering the night before surgery and morning of surgery  Before surgery, you can play an important role by reducing the number of germs on your skin.  CHG (Chlorhexidine gluconate) soap is an antiseptic cleanser which kills germs and bonds with the skin to continue killing germs even after washing.  Please do not use if you have an allergy to CHG or antibacterial soaps. If your skin becomes reddened/irritated stop using the CHG.  1. Shower the NIGHT BEFORE SURGERY and the MORNING OF SURGERY with CHG soap.  2. If you choose to wash your hair, wash your hair first as usual with your normal shampoo.  3. After shampooing, rinse your hair and body thoroughly to remove the shampoo.  4. Use CHG as you would any other liquid soap. You can apply CHG directly to the skin and wash gently with a scrungie or a clean washcloth.  5. Apply the CHG soap to your body only from the neck down. Do not use on open wounds or open sores. Avoid contact with your eyes, ears, mouth, and genitals (private parts). Wash face and genitals (private parts) with your normal soap.  6. Wash thoroughly, paying special attention to the area where your surgery will be performed.  7. Thoroughly rinse your body with warm water.  8. Do not shower/wash with your normal soap after using and rinsing off the CHG soap.  9. Pat yourself dry with a clean towel.  10. Wear clean pajamas to bed the night before surgery.  12. Place clean sheets on your bed the night of your first shower and do not sleep with pets.  13. Shower again with the CHG soap on the day of surgery prior to arriving at the hospital.  14. Do not apply any deodorants/lotions/powders.  15. Please wear clean clothes to the hospital.

## 2022-05-10 NOTE — Pre-Procedure Instructions (Addendum)
Patient called back, reviewed chart only. Patient stated that is working on getting a person stay with her after the surgery. Wanted to call us back first, then will call person to confirm the surgery date. No changes to past medical history updated medications and emailed patient instructions.

## 2022-05-17 ENCOUNTER — Ambulatory Visit: Payer: 59

## 2022-05-17 ENCOUNTER — Other Ambulatory Visit: Payer: Self-pay

## 2022-05-17 ENCOUNTER — Ambulatory Visit
Admission: RE | Admit: 2022-05-17 | Discharge: 2022-05-17 | Disposition: A | Discharge status: Home / Self Care | Payer: 59 | Attending: Surgery | Admitting: Surgery

## 2022-05-17 ENCOUNTER — Encounter
Admission: RE | Disposition: A | Discharge status: Home / Self Care | Payer: Self-pay | Source: Home / Self Care | Attending: Surgery

## 2022-05-17 ENCOUNTER — Encounter: Payer: Self-pay | Admitting: Surgery

## 2022-05-17 ENCOUNTER — Ambulatory Visit: Payer: 59 | Admitting: Urgent Care

## 2022-05-17 DIAGNOSIS — I10 Essential (primary) hypertension: Secondary | ICD-10-CM | POA: Diagnosis not present

## 2022-05-17 DIAGNOSIS — Z794 Long term (current) use of insulin: Secondary | ICD-10-CM | POA: Diagnosis not present

## 2022-05-17 DIAGNOSIS — Z7984 Long term (current) use of oral hypoglycemic drugs: Secondary | ICD-10-CM | POA: Diagnosis not present

## 2022-05-17 DIAGNOSIS — D170 Benign lipomatous neoplasm of skin and subcutaneous tissue of head, face and neck: Secondary | ICD-10-CM

## 2022-05-17 DIAGNOSIS — J42 Unspecified chronic bronchitis: Secondary | ICD-10-CM | POA: Diagnosis not present

## 2022-05-17 DIAGNOSIS — F17210 Nicotine dependence, cigarettes, uncomplicated: Secondary | ICD-10-CM | POA: Insufficient documentation

## 2022-05-17 DIAGNOSIS — E119 Type 2 diabetes mellitus without complications: Secondary | ICD-10-CM | POA: Insufficient documentation

## 2022-05-17 HISTORY — PX: LIPOMA EXCISION: SHX5283

## 2022-05-17 LAB — GLUCOSE, CAPILLARY
Glucose-Capillary: 149 mg/dL — ABNORMAL HIGH (ref 70–99)
Glucose-Capillary: 134 mg/dL — ABNORMAL HIGH (ref 70–99)

## 2022-05-17 SURGERY — EXCISION LIPOMA
Anesthesia: General | Site: Neck | Laterality: Right

## 2022-05-17 MED ORDER — ACETAMINOPHEN 500 MG PO TABS
ORAL_TABLET | ORAL | Status: AC
  Filled 2022-05-17: qty 2

## 2022-05-17 MED ORDER — EPHEDRINE 5 MG/ML INJ
INTRAVENOUS | Status: AC
  Filled 2022-05-17: qty 5

## 2022-05-17 MED ORDER — SUGAMMADEX SODIUM 200 MG/2ML IV SOLN
INTRAVENOUS | Status: DC | PRN
  Administered 2022-05-17: 200 mg via INTRAVENOUS

## 2022-05-17 MED ORDER — BUPIVACAINE LIPOSOME 1.3 % IJ SUSP
INTRAMUSCULAR | Status: AC
  Filled 2022-05-17: qty 10

## 2022-05-17 MED ORDER — OXYCODONE HCL 5 MG/5ML PO SOLN: 5.0000 mg | Freq: Once | ORAL | Status: DC | PRN

## 2022-05-17 MED ORDER — CEFAZOLIN SODIUM-DEXTROSE 2-4 GM/100ML-% IV SOLN
2.0000 g | INTRAVENOUS | Status: AC
  Administered 2022-05-17: 2 g via INTRAVENOUS

## 2022-05-17 MED ORDER — LIDOCAINE HCL (PF) 2 % IJ SOLN
INTRAMUSCULAR | Status: AC
  Filled 2022-05-17: qty 5

## 2022-05-17 MED ORDER — DEXAMETHASONE SODIUM PHOSPHATE 10 MG/ML IJ SOLN
INTRAMUSCULAR | Status: AC
  Filled 2022-05-17: qty 1

## 2022-05-17 MED ORDER — CEFAZOLIN SODIUM-DEXTROSE 2-4 GM/100ML-% IV SOLN
INTRAVENOUS | Status: AC
  Filled 2022-05-17: qty 100

## 2022-05-17 MED ORDER — BUPIVACAINE LIPOSOME 1.3 % IJ SUSP
INTRAMUSCULAR | Status: AC
  Filled 2022-05-17: qty 20

## 2022-05-17 MED ORDER — CHLORHEXIDINE GLUCONATE 0.12 % MT SOLN
15.0000 mL | Freq: Once | OROMUCOSAL | Status: AC
  Administered 2022-05-17: 15 mL via OROMUCOSAL

## 2022-05-17 MED ORDER — PHENYLEPHRINE 80 MCG/ML (10ML) SYRINGE FOR IV PUSH (FOR BLOOD PRESSURE SUPPORT)
PREFILLED_SYRINGE | INTRAVENOUS | Status: AC
  Filled 2022-05-17: qty 10

## 2022-05-17 MED ORDER — BUPIVACAINE LIPOSOME 1.3 % IJ SUSP
INTRAMUSCULAR | Status: DC | PRN
  Administered 2022-05-17: 30 mL via INTRAMUSCULAR

## 2022-05-17 MED ORDER — FENTANYL CITRATE (PF) 100 MCG/2ML IJ SOLN: 25.0000 ug | INTRAMUSCULAR | Status: DC | PRN

## 2022-05-17 MED ORDER — IBUPROFEN 600 MG PO TABS: 600.0000 mg | ORAL_TABLET | Freq: Three times a day (TID) | ORAL | 0 refills | Status: AC | PRN

## 2022-05-17 MED ORDER — CHLORHEXIDINE GLUCONATE CLOTH 2 % EX PADS: 6.0000 | MEDICATED_PAD | Freq: Once | CUTANEOUS | Status: DC

## 2022-05-17 MED ORDER — PHENYLEPHRINE 80 MCG/ML (10ML) SYRINGE FOR IV PUSH (FOR BLOOD PRESSURE SUPPORT)
PREFILLED_SYRINGE | INTRAVENOUS | Status: DC | PRN
  Administered 2022-05-17 (×3): 80 ug via INTRAVENOUS
  Administered 2022-05-17: 160 ug via INTRAVENOUS

## 2022-05-17 MED ORDER — ACETAMINOPHEN 500 MG PO TABS: 1000.0000 mg | ORAL_TABLET | Freq: Four times a day (QID) | ORAL | Status: AC | PRN

## 2022-05-17 MED ORDER — SODIUM CHLORIDE FLUSH 0.9 % IV SOLN
INTRAVENOUS | Status: AC
  Filled 2022-05-17: qty 10

## 2022-05-17 MED ORDER — BUPIVACAINE LIPOSOME 1.3 % IJ SUSP: 20.0000 mL | Freq: Once | INTRAMUSCULAR | Status: DC

## 2022-05-17 MED ORDER — OXYCODONE HCL 5 MG PO TABS: 5.0000 mg | ORAL_TABLET | Freq: Once | ORAL | Status: DC | PRN

## 2022-05-17 MED ORDER — ACETAMINOPHEN 500 MG PO TABS
1000.0000 mg | ORAL_TABLET | ORAL | Status: AC
  Administered 2022-05-17: 1000 mg via ORAL

## 2022-05-17 MED ORDER — FENTANYL CITRATE (PF) 100 MCG/2ML IJ SOLN
INTRAMUSCULAR | Status: DC | PRN
  Administered 2022-05-17 (×2): 50 ug via INTRAVENOUS

## 2022-05-17 MED ORDER — MIDAZOLAM HCL 2 MG/2ML IJ SOLN
INTRAMUSCULAR | Status: AC
  Filled 2022-05-17: qty 2

## 2022-05-17 MED ORDER — CHLORHEXIDINE GLUCONATE CLOTH 2 % EX PADS
6.0000 | MEDICATED_PAD | Freq: Once | CUTANEOUS | Status: AC
  Administered 2022-05-17: 6 via TOPICAL

## 2022-05-17 MED ORDER — ORAL CARE MOUTH RINSE: 15.0000 mL | Freq: Once | OROMUCOSAL | Status: AC

## 2022-05-17 MED ORDER — PROPOFOL 10 MG/ML IV BOLUS
INTRAVENOUS | Status: DC | PRN
  Administered 2022-05-17: 150 mg via INTRAVENOUS

## 2022-05-17 MED ORDER — EPHEDRINE SULFATE (PRESSORS) 50 MG/ML IJ SOLN
INTRAMUSCULAR | Status: DC | PRN
  Administered 2022-05-17 (×3): 5 mg via INTRAVENOUS
  Administered 2022-05-17: 10 mg via INTRAVENOUS

## 2022-05-17 MED ORDER — 0.9 % SODIUM CHLORIDE (POUR BTL) OPTIME
TOPICAL | Status: DC | PRN
  Administered 2022-05-17: 500 mL

## 2022-05-17 MED ORDER — DEXAMETHASONE SODIUM PHOSPHATE 10 MG/ML IJ SOLN
INTRAMUSCULAR | Status: DC | PRN
  Administered 2022-05-17: 5 mg via INTRAVENOUS

## 2022-05-17 MED ORDER — FENTANYL CITRATE (PF) 100 MCG/2ML IJ SOLN
INTRAMUSCULAR | Status: AC
  Filled 2022-05-17: qty 2

## 2022-05-17 MED ORDER — SUCCINYLCHOLINE CHLORIDE 200 MG/10ML IV SOSY
PREFILLED_SYRINGE | INTRAVENOUS | Status: DC | PRN
  Administered 2022-05-17: 180 mg via INTRAVENOUS

## 2022-05-17 MED ORDER — SODIUM CHLORIDE 0.9 % IV SOLN: INTRAVENOUS | Status: DC

## 2022-05-17 MED ORDER — KETAMINE HCL 50 MG/5ML IJ SOSY
PREFILLED_SYRINGE | INTRAMUSCULAR | Status: AC
  Filled 2022-05-17: qty 5

## 2022-05-17 MED ORDER — BUPIVACAINE HCL (PF) 0.5 % IJ SOLN
INTRAMUSCULAR | Status: AC
  Filled 2022-05-17: qty 30

## 2022-05-17 MED ORDER — OXYCODONE HCL 5 MG PO TABS: 5.0000 mg | ORAL_TABLET | Freq: Four times a day (QID) | ORAL | 0 refills | Status: DC | PRN

## 2022-05-17 MED ORDER — DEXMEDETOMIDINE HCL IN NACL 80 MCG/20ML IV SOLN
INTRAVENOUS | Status: DC | PRN
  Administered 2022-05-17: 8 ug via BUCCAL

## 2022-05-17 MED ORDER — ONDANSETRON HCL 4 MG/2ML IJ SOLN
INTRAMUSCULAR | Status: DC | PRN
  Administered 2022-05-17: 4 mg via INTRAVENOUS

## 2022-05-17 MED ORDER — LIDOCAINE HCL (CARDIAC) PF 100 MG/5ML IV SOSY
PREFILLED_SYRINGE | INTRAVENOUS | Status: DC | PRN
  Administered 2022-05-17: 100 mg via INTRAVENOUS

## 2022-05-17 MED ORDER — ONDANSETRON HCL 4 MG/2ML IJ SOLN
INTRAMUSCULAR | Status: AC
  Filled 2022-05-17: qty 2

## 2022-05-17 MED ORDER — GABAPENTIN 300 MG PO CAPS
300.0000 mg | ORAL_CAPSULE | ORAL | Status: AC
  Administered 2022-05-17: 300 mg via ORAL

## 2022-05-17 MED ORDER — MIDAZOLAM HCL 2 MG/2ML IJ SOLN
INTRAMUSCULAR | Status: DC | PRN
  Administered 2022-05-17: 2 mg via INTRAVENOUS

## 2022-05-17 MED ORDER — GABAPENTIN 300 MG PO CAPS
ORAL_CAPSULE | ORAL | Status: AC
  Filled 2022-05-17: qty 1

## 2022-05-17 MED ORDER — CHLORHEXIDINE GLUCONATE 0.12 % MT SOLN
OROMUCOSAL | Status: AC
  Filled 2022-05-17: qty 15

## 2022-05-17 MED ORDER — ROCURONIUM BROMIDE 100 MG/10ML IV SOLN
INTRAVENOUS | Status: DC | PRN
  Administered 2022-05-17: 30 mg via INTRAVENOUS

## 2022-05-17 MED ORDER — KETAMINE HCL 10 MG/ML IJ SOLN
INTRAMUSCULAR | Status: DC | PRN
  Administered 2022-05-17: 10 mg via INTRAVENOUS

## 2022-05-17 SURGICAL SUPPLY — 35 items
ADH SKN CLS APL DERMABOND .7 (GAUZE/BANDAGES/DRESSINGS) ×1
APL PRP STRL LF DISP 70% ISPRP (MISCELLANEOUS) ×1
CHLORAPREP W/TINT 26 (MISCELLANEOUS) ×2 IMPLANT
DERMABOND ADVANCED .7 DNX12 (GAUZE/BANDAGES/DRESSINGS) ×2 IMPLANT
DRAPE 3/4 80X56 (DRAPES) ×4 IMPLANT
DRAPE LAPAROTOMY 100X77 ABD (DRAPES) ×2 IMPLANT
ELECT CAUTERY BLADE TIP 2.5 (TIP) ×1
ELECT REM PT RETURN 9FT ADLT (ELECTROSURGICAL) ×1
ELECTRODE CAUTERY BLDE TIP 2.5 (TIP) ×2 IMPLANT
ELECTRODE REM PT RTRN 9FT ADLT (ELECTROSURGICAL) ×2 IMPLANT
GAUZE 4X4 16PLY ~~LOC~~+RFID DBL (SPONGE) ×2 IMPLANT
GLOVE SURG SYN 7.0 (GLOVE) ×1 IMPLANT
GLOVE SURG SYN 7.0 PF PI (GLOVE) ×2 IMPLANT
GLOVE SURG SYN 7.5  E (GLOVE) ×1
GLOVE SURG SYN 7.5 E (GLOVE) ×1 IMPLANT
GLOVE SURG SYN 7.5 PF PI (GLOVE) ×2 IMPLANT
GOWN STRL REUS W/ TWL LRG LVL3 (GOWN DISPOSABLE) ×4 IMPLANT
GOWN STRL REUS W/TWL LRG LVL3 (GOWN DISPOSABLE) ×2
KIT TURNOVER KIT A (KITS) ×2 IMPLANT
LABEL OR SOLS (LABEL) ×2 IMPLANT
MANIFOLD NEPTUNE II (INSTRUMENTS) ×2 IMPLANT
NEEDLE HYPO 22GX1.5 SAFETY (NEEDLE) ×2 IMPLANT
NS IRRIG 1000ML POUR BTL (IV SOLUTION) ×2 IMPLANT
PACK BASIN MINOR ARMC (MISCELLANEOUS) ×2 IMPLANT
SUT MNCRL 4-0 (SUTURE) ×1
SUT MNCRL 4-0 27XMFL (SUTURE) ×1
SUT VIC AB 0 SH 27 (SUTURE) ×4 IMPLANT
SUT VIC AB 2-0 SH 27 (SUTURE) ×1
SUT VIC AB 2-0 SH 27XBRD (SUTURE) IMPLANT
SUT VIC AB 3-0 SH 27 (SUTURE) ×2
SUT VIC AB 3-0 SH 27X BRD (SUTURE) ×4 IMPLANT
SUTURE MNCRL 4-0 27XMF (SUTURE) ×2 IMPLANT
SYR 30ML LL (SYRINGE) ×2 IMPLANT
TRAP FLUID SMOKE EVACUATOR (MISCELLANEOUS) ×2 IMPLANT
WATER STERILE IRR 500ML POUR (IV SOLUTION) ×2 IMPLANT

## 2022-05-17 NOTE — Anesthesia Preprocedure Evaluation (Signed)
Anesthesia Evaluation  Patient identified by MRN, date of birth, ID band Patient awake    Reviewed: Allergy & Precautions, NPO status , Patient's Chart, lab work & pertinent test results  History of Anesthesia Complications Negative for: history of anesthetic complications  Airway Mallampati: III  TM Distance: >3 FB Neck ROM: full    Dental  (+) Chipped, Poor Dentition, Missing   Pulmonary neg shortness of breath, Current Smoker and Patient abstained from smoking.   Pulmonary exam normal        Cardiovascular Exercise Tolerance: Good hypertension, (-) angina (-) Past MI Normal cardiovascular exam     Neuro/Psych   Anxiety     negative neurological ROS     GI/Hepatic negative GI ROS, Neg liver ROS,,,  Endo/Other  negative endocrine ROSdiabetes, Type 2, Insulin Dependent    Renal/GU      Musculoskeletal   Abdominal   Peds  Hematology negative hematology ROS (+)   Anesthesia Other Findings Past Medical History: No date: Anxiety No date: Bronchitis     Comment:  chronic No date: Chronic bronchitis (HCC) No date: Diabetes mellitus without complication (HCC) No date: Hypertension  Past Surgical History: No date: CARPAL TUNNEL RELEASE; Right No date: CHOLECYSTECTOMY 11/05/2021: DEBRIDMENT OF DECUBITUS ULCER; N/A     Comment:  Procedure: DEBRIDMENT OF DECUBITUS ULCER;  Surgeon:               Olean Ree, MD;  Location: [facility] ORS;  Service:               General;  Laterality: N/A; No date: TONSILLECTOMY     Comment:  age 62 or 62  BMI    Body Mass Index: 34.78 kg/m      Reproductive/Obstetrics negative OB ROS                             Anesthesia Physical Anesthesia Plan  ASA: 3  Anesthesia Plan: General ETT   Post-op Pain Management:    Induction: Intravenous  PONV Risk Score and Plan: Ondansetron, Dexamethasone, Midazolam and Treatment may vary due to age or medical  condition  Airway Management Planned: Oral ETT and Video Laryngoscope Planned  Additional Equipment:   Intra-op Plan:   Post-operative Plan: Extubation in OR  Informed Consent: I have reviewed the patients History and Physical, chart, labs and discussed the procedure including the risks, benefits and alternatives for the proposed anesthesia with the patient or authorized representative who has indicated his/her understanding and acceptance.     Dental Advisory Given  Plan Discussed with: Anesthesiologist, CRNA and Surgeon  Anesthesia Plan Comments: (Patient consented for risks of anesthesia including but not limited to:  - adverse reactions to medications - damage to eyes, teeth, lips or other oral mucosa - nerve damage due to positioning  - sore throat or hoarseness - Damage to heart, brain, nerves, lungs, other parts of body or loss of life  Patient voiced understanding.)       Anesthesia Quick Evaluation

## 2022-05-17 NOTE — Transfer of Care (Cosign Needed)
Immediate Anesthesia Transfer of Care Note  Patient: Jamie Rodriguez  Procedure(s) Performed: EXCISION LIPOMA, posterior neck (Right: Neck)  Patient Location: PACU  Anesthesia Type:General  Level of Consciousness: drowsy  Airway & Oxygen Therapy: Patient Spontanous Breathing and Patient connected to face mask oxygen  Post-op Assessment: Report given to RN and Post -op Vital signs reviewed and stable  Post vital signs: Reviewed and stable  Last Vitals:  Vitals Value Taken Time  BP 125/78 05/17/22 1230  Temp    Pulse 77 05/17/22 1233  Resp 20 05/17/22 1233  SpO2 100 % 05/17/22 1233  Vitals shown include unvalidated device data.  Last Pain:  Vitals:   05/17/22 0905  TempSrc: Temporal  PainSc: 0-No pain         Complications: No notable events documented.

## 2022-05-17 NOTE — Interval H&P Note (Signed)
History and Physical Interval Note:  05/17/2022 10:24 AM  Jamie Rodriguez  has presented today for surgery, with the diagnosis of neck lipoma.  The various methods of treatment have been discussed with the patient and family. After consideration of risks, benefits and other options for treatment, the patient has consented to  Procedure(s): EXCISION LIPOMA, posterior neck (Right) as a surgical intervention.  The patient's history has been reviewed, patient examined, no change in status, stable for surgery.  I have reviewed the patient's chart and labs.  Questions were answered to the patient's satisfaction.     [name]

## 2022-05-17 NOTE — Op Note (Signed)
  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  Procedure:  Excision of posterior neck lipoma  Surgeon:  Melvyn Neth, MD  Anesthesia:  General endotracheal  Estimated Blood Loss:  5 ml  Specimens:  Posterior neck lipoma  Complications:  None  Indications for Procedure:  This is a 62 y.o. female with diagnosis of a symptomatic posterior neck lipoma.  The patient wishes to have this excised. The risks of bleeding, abscess or infection, injury to surrounding structures, and need for further procedures were all discussed with the patient and she was willing to proceed.  Description of Procedure: The patient was correctly identified in the preoperative area and brought into the operating room.  The patient was placed supine with VTE prophylaxis in place.  Appropriate time-outs were performed.  Anesthesia was induced and the patient was intubated.  Appropriate antibiotics were infused.  She was then placed in left lateral decubitus position.  The patient's posterior neck was prepped and draped in usual sterile fashion.  A 6.5 cm incision was made over the lipoma, and cautery was used to dissect down the subcutaneous tissue to the lipoma itself.  Skin flaps were created using cautery as well, and then the lipoma was excised using cautery, intact.  It was sent off to pathology.  The cavity was then irrigated and hemostasis was assured with cautery.  Local anesthetic was infused intradermally.  The skin flaps were then approximated to the wound bed using multiple 2-0 Vicryl sutures in order to decrease the amount of dead space in the wound.  The wound edges were then closed in three layers using 2-0 Vicryl, 3-0 Vicryl and 4-0 Monocryl.  The incision was cleaned and sealed with DermaBond.  The patient was then emerged from anesthesia, extubated, and brought to the recovery room for further management.     The patient tolerated the procedure well and all counts were correct at the end of the case.   Melvyn Neth, MD

## 2022-05-17 NOTE — Anesthesia Procedure Notes (Cosign Needed)
Procedure Name: Intubation Date/Time: 05/17/2022 11:11 AM  Performed by: Andria Frames, MDPre-anesthesia Checklist: Patient identified, Emergency Drugs available, Suction available and Patient being monitored Patient Re-evaluated:Patient Re-evaluated prior to induction Oxygen Delivery Method: Circle system utilized Preoxygenation: Pre-oxygenation with 100% oxygen Induction Type: IV induction and Rapid sequence Ventilation: Mask ventilation without difficulty Laryngoscope Size: McGraph and 3 Grade View: Grade I Tube type: Oral Tube size: 7.0 mm Number of attempts: 1 Airway Equipment and Method: Stylet and Video-laryngoscopy Placement Confirmation: ETT inserted through vocal cords under direct vision, positive ETCO2 and breath sounds checked- equal and bilateral Secured at: 23 cm Tube secured with: Tape Dental Injury: Teeth and Oropharynx as per pre-operative assessment  Comments: Intubated by [place] under CRNA and MDA supervision

## 2022-05-17 NOTE — Anesthesia Postprocedure Evaluation (Signed)
Anesthesia Post Note  Patient: Amalya Salmons Nordhoff  Procedure(s) Performed: EXCISION LIPOMA, posterior neck (Right: Neck)  Patient location during evaluation: PACU Anesthesia Type: General Level of consciousness: awake and alert Pain management: pain level controlled Vital Signs Assessment: post-procedure vital signs reviewed and stable Respiratory status: spontaneous breathing, nonlabored ventilation, respiratory function stable and patient connected to nasal cannula oxygen Cardiovascular status: blood pressure returned to baseline and stable Postop Assessment: no apparent nausea or vomiting Anesthetic complications: no   No notable events documented.   Last Vitals:  Vitals:   05/17/22 1230 05/17/22 1256  BP: 125/78 (!) 141/96  Pulse: 85 83  Resp: 17 16  Temp: (!) 36.1 C (!) 36.2 C  SpO2: 100% 95%    Last Pain:  Vitals:   05/17/22 1256  TempSrc: Temporal  PainSc: 0-No pain                 Precious Haws Aryn Safran

## 2022-05-17 NOTE — Addendum Note (Signed)
Addendum  created 05/17/22 1800 by Adalberto Ill, CRNA   Intraprocedure Event edited

## 2022-05-17 NOTE — Discharge Instructions (Addendum)
AMBULATORY SURGERY  DISCHARGE INSTRUCTIONS   The drugs that you were given will stay in your system until tomorrow so for the next 24 hours you should not:  Drive an automobile Make any legal decisions Drink any alcoholic beverage   You may resume regular meals tomorrow.  Today it is better to start with liquids and gradually work up to solid foods.  You may eat anything you prefer, but it is better to start with liquids, then soup and crackers, and gradually work up to solid foods.   Please notify your doctor immediately if you have any unusual bleeding, trouble breathing, redness and pain at the surgery site, drainage, fever, or pain not relieved by medication.    Additional Instructions: PLEASE DO NOT REMOVE GREEN ARMBAND FOR 4 DAYS   Please contact your physician with any problems or Same Day Surgery at [phone], Monday through Friday 6 am to 4 pm, or [place] at [facility] number at [phone].

## 2022-05-18 ENCOUNTER — Encounter: Payer: Self-pay | Admitting: Surgery

## 2022-05-19 LAB — SURGICAL PATHOLOGY

## 2022-05-31 ENCOUNTER — Ambulatory Visit (INDEPENDENT_AMBULATORY_CARE_PROVIDER_SITE_OTHER): Payer: 59 | Admitting: Physician Assistant

## 2022-05-31 ENCOUNTER — Encounter: Payer: Self-pay | Admitting: Physician Assistant

## 2022-05-31 VITALS — BP 152/98 | HR 72 | Temp 98.7°F | Ht 66.0 in | Wt 206.0 lb

## 2022-05-31 DIAGNOSIS — Z09 Encounter for follow-up examination after completed treatment for conditions other than malignant neoplasm: Secondary | ICD-10-CM

## 2022-05-31 DIAGNOSIS — D170 Benign lipomatous neoplasm of skin and subcutaneous tissue of head, face and neck: Secondary | ICD-10-CM

## 2022-05-31 NOTE — Patient Instructions (Signed)
If you have any concerns or questions, please feel free to call our office.   Excision of Skin Lesions, Care After The following information offers guidance on how to care for yourself after your procedure. Your health care provider may also give you more specific instructions. If you have problems or questions, contact your health care provider. What can I expect after the procedure? After your procedure, it is common to have: Soreness or mild pain. Some redness and swelling. Follow these instructions at home: Excision site care  Follow instructions from your health care provider about how to take care of your excision site. Make sure you: Wash your hands with soap and water for at least 20 seconds before and after you change your bandage (dressing). If soap and water are not available, use hand sanitizer. Change your dressing as told by your health care provider. Leave stitches (sutures), skin glue, or adhesive strips in place. These skin closures may need to stay in place for 2 weeks or longer. If adhesive strip edges start to loosen and curl up, you may trim the loose edges. Do not remove adhesive strips completely unless your health care provider tells you to do that. Check the excision area every day for signs of infection. Watch for: More redness, swelling, or pain. Fluid or blood. Warmth. Pus or a bad smell. Keep the site clean, dry, and protected for at least 48 hours. For bleeding, apply gentle but firm pressure to the area using a folded towel for 20 minutes. Do not take baths, swim, or use a hot tub until your health care provider approves. Ask your health care provider if you may take showers. You may only be allowed to take sponge baths. General instructions Take over-the-counter and prescription medicines only as told by your health care provider. Follow instructions from your health care provider about how to minimize scarring. Scarring should lessen over time. Avoid sun  exposure until the area has healed. Use sunscreen to protect the area from the sun after it has healed. Avoid high-impact exercise and activities until the sutures are removed or the area heals. Keep all follow-up visits. This is important. Contact a health care provider if: You have more redness, swelling, or pain around your excision site. You have fluid or blood coming from your excision site. Your excision site feels warm to the touch. You have pus or a bad smell coming from your excision site. You have a fever. You have pain that does not improve in 2-3 days after your procedure. Get help right away if: You have bleeding that does not stop with pressure or a dressing. Your wound opens up. Summary Take over-the-counter and prescription medicines only as told by your health care provider. Change your dressing as told by your health care provider. Contact a health care provider if you have redness, swelling, pain, or other signs of infection around your excision site. Keep all follow-up visits. This is important. This information is not intended to replace advice given to you by your health care provider. Make sure you discuss any questions you have with your health care provider. Document Revised: 11/10/2020 Document Reviewed: 11/10/2020 Elsevier Patient Education  [place].

## 2022-05-31 NOTE — Progress Notes (Signed)
[facility] POST-OP OFFICE VISIT  05/31/2022  HPI: Alilah Mcmeans Nadeau is a 62 y.o. female 14 days s/p excision of posterior neck lipoma (6.5 x 3 cm) with Dr Hampton Abbot   Doing well No pain, chills, fever Itches occasionally Incision is healing well; dermabond flaking off No other complaints   Vital signs: BP (!) 152/98   Pulse 72   Temp 98.7 F (37.1 C) (Oral)   Ht '5\' 6"'$  (1.676 m)   Wt 206 lb (93.4 kg)   SpO2 100%   BMI 33.25 kg/m    Physical Exam: Constitutional: Well appearing female, NAD Skin: ~6 cm incision to the right posterior neck. This is well healed. Dermabond flaking off. No erythema, no drainage. No swelling nor evidence of seroma nor hematoma.   Assessment/Plan: This is a 63 y.o. female 14 days s/p excision of posterior neck lipoma (6.5 x 3 cm) with Dr Hampton Abbot    - Pain control prn  - Reviewed wound care recommendation  - Reviewed surgical pathology; Lipoma   - She can follow up on as needed basis; She understands to call with questions/concerns  -- Edison Simon, PA-C [facility] 05/31/2022, 2:03 PM M-F: 7am - 4pm

## 2022-07-08 ENCOUNTER — Ambulatory Visit (INDEPENDENT_AMBULATORY_CARE_PROVIDER_SITE_OTHER): Payer: 59 | Admitting: Surgery

## 2022-07-08 ENCOUNTER — Encounter: Payer: Self-pay | Admitting: Surgery

## 2022-07-08 VITALS — BP 130/84 | HR 84 | Temp 98.0°F | Ht 66.0 in | Wt 210.0 lb

## 2022-07-08 DIAGNOSIS — D170 Benign lipomatous neoplasm of skin and subcutaneous tissue of head, face and neck: Secondary | ICD-10-CM

## 2022-07-08 DIAGNOSIS — Z09 Encounter for follow-up examination after completed treatment for conditions other than malignant neoplasm: Secondary | ICD-10-CM

## 2022-07-08 DIAGNOSIS — L89153 Pressure ulcer of sacral region, stage 3: Secondary | ICD-10-CM

## 2022-07-08 NOTE — Progress Notes (Signed)
07/08/2022  HPI: Jamie Rodriguez is a 62 y.o. female s/p excision of posterior right neck lipoma on 05/17/22.  She presents for follow up as she reports that she is feeling two small knots over her incision that have been ongoing since after surgery.  Denies any significant pain or issues with the incision itself, but mainly reports the two knots.  She's not able to see them well on the mirror due to their location.  She is also s/p sacral wound debridement on 11/05/21 and she would like me to look at her wound just in case.  Vital signs: BP 130/84   Pulse 84   Temp 98 F (36.7 C)   Ht 5\' 6"  (1.676 m)   Wt 210 lb (95.3 kg)   SpO2 97%   BMI 33.89 kg/m    Physical Exam: Constitutional:  No acute distress Neck:  right posterior neck incision is well healed.  There are two knots consistent with the suture knots used on either corner of the incision.  These were cut off and a dry gauze dressing applied. Back:  Low back scar from wound debridement is well healed, without any dehiscence or residual open wound.  The scar is dry/scaly.  Assessment/Plan: This is a 62 y.o. female s/p excision of posterior neck lipoma.  --Discussed with the patient that what she was feeling are the knots from the suture used to close the incision.  These were cut off and the wound dressed with dry gauze.  She can remove this tomorrow and shower as usual. --Her low back wound is fully healed but has dry/scaly skin.  Discussed using moisturizing lotion or cocoa butter lotion to help. --Follow up as needed.   Melvyn Neth, [facility]

## 2022-07-08 NOTE — Patient Instructions (Signed)
May rub Vitamin-E oil, cocoa butter, or other emmolient agent in area 2-3 times a day to soften. You will need to use sunscreen for the next year on the area to minimize altered pigmentation of the site.  You may remove your dressing tomorrow.  You may use Coco butter or moisturizing lotion to your backside.    Follow-up with our office as needed.  Please call and ask to speak with a nurse if you develop questions or concerns.

## 2022-07-11 ENCOUNTER — Emergency Department: Admission: EM | Admit: 2022-07-11 | Discharge: 2022-07-11 | Discharge status: Against Medical Advice | Payer: 59

## 2022-07-11 NOTE — ED Notes (Signed)
Pt requesting to be removed from waitlist and states no longer wishes to be seen. Pt educated she is welcome and able to change her mind at any time and let staff know. Pt remains in waiting room with friend who is waiting to be seen.

## 2022-11-29 ENCOUNTER — Other Ambulatory Visit: Payer: Self-pay

## 2022-11-29 ENCOUNTER — Emergency Department
Admission: EM | Admit: 2022-11-29 | Discharge: 2022-11-29 | Disposition: A | Discharge status: Home / Self Care | Payer: 59 | Attending: Student in an Organized Health Care Education/Training Program | Admitting: Student in an Organized Health Care Education/Training Program

## 2022-11-29 DIAGNOSIS — Z5321 Procedure and treatment not carried out due to patient leaving prior to being seen by health care provider: Secondary | ICD-10-CM | POA: Diagnosis not present

## 2022-11-29 DIAGNOSIS — M79672 Pain in left foot: Secondary | ICD-10-CM | POA: Diagnosis present

## 2022-11-29 LAB — URINALYSIS, ROUTINE W REFLEX MICROSCOPIC
Bilirubin Urine: NEGATIVE
Glucose, UA: NEGATIVE mg/dL
Ketones, ur: NEGATIVE mg/dL
Nitrite: NEGATIVE
Protein, ur: NEGATIVE mg/dL
RBC / HPF: >50 RBC/hpf (ref 0–5)
Specific Gravity, Urine: 1.02 (ref 1.005–1.030)
pH: 5 (ref 5.0–8.0)

## 2022-11-29 NOTE — ED Triage Notes (Signed)
Pt comes with c/o left foot pain for three weeks. Pt states bump that might be infected. Pt states it is embedded.   Pt also wants urine tested for UTI.

## 2022-11-29 NOTE — ED Notes (Signed)
Pt shown where the bathroom is, and pt back into her room at this time

## 2022-11-29 NOTE — ED Notes (Signed)
PA went into pt room, and pt was missing. RN did not see pt leave, pt did not show back into room after checking bathrooms. PA aware, no orders.

## 2022-11-29 NOTE — ED Notes (Signed)
Pt came out of room asking when the doctor would be in, RN notified PA that pt was wondering when she would be assessed. PA stated that it would be a minute as he was rounding on a few patients, pt notified that she was in line to be seen and agreeable at this time.

## 2022-11-29 NOTE — ED Notes (Signed)
Pt called said she left and said she had been waiting for several hours, explained the ED has been busy, pt asking about urine results informed her she could look in her my chart.

## 2023-02-13 ENCOUNTER — Ambulatory Visit (INDEPENDENT_AMBULATORY_CARE_PROVIDER_SITE_OTHER): Payer: 59 | Admitting: Surgery

## 2023-02-13 ENCOUNTER — Encounter: Payer: Self-pay | Admitting: Surgery

## 2023-02-13 VITALS — BP 119/78 | HR 99 | Temp 98.7°F | Ht 66.0 in | Wt 212.8 lb

## 2023-02-13 DIAGNOSIS — L89153 Pressure ulcer of sacral region, stage 3: Secondary | ICD-10-CM | POA: Diagnosis not present

## 2023-02-13 NOTE — Progress Notes (Signed)
02/13/2023  History of Present Illness: Jamie Rodriguez is a 62 y.o. female presenting for follow up of a sacral decubitus wound.  She had debridement of a sacral wound on 11/05/21.  She presents because she has been feeling some pressure and pulling on the area and since she cannot see it well, she wanted to make sure it's doing ok.  Denies any specific worsening pain, drainage from the area, or other concerns.    Of note, she is asking if we do weight loss surgery.  Past Medical History: Past Medical History:  Diagnosis Date   Anxiety    Bronchitis    chronic   Chronic bronchitis (HCC)    Diabetes mellitus without complication (HCC)    Hypertension      Past Surgical History: Past Surgical History:  Procedure Laterality Date   CARPAL TUNNEL RELEASE Right    CHOLECYSTECTOMY     DEBRIDMENT OF DECUBITUS ULCER N/A 11/05/2021   Procedure: DEBRIDMENT OF DECUBITUS ULCER;  Surgeon: Henrene Dodge, MD;  Location: [facility] ORS;  Service: General;  Laterality: N/A;   LIPOMA EXCISION Right 05/17/2022   Procedure: EXCISION LIPOMA, posterior neck;  Surgeon: Henrene Dodge, MD;  Location: [facility] ORS;  Service: General;  Laterality: Right;   TONSILLECTOMY     age 61 or 20    Home Medications: Prior to Admission medications   Medication Sig Start Date End Date Taking? Authorizing Provider  acetaminophen (TYLENOL) 500 MG tablet Take 2 tablets (1,000 mg total) by mouth every 6 (six) hours as needed for mild pain or fever. 11/08/21  Yes Pennie Banter, DO  acetaminophen (TYLENOL) 500 MG tablet Take 2 tablets (1,000 mg total) by mouth every 6 (six) hours as needed for mild pain. 05/17/22  Yes Kijuan Gallicchio, Elita Quick, MD  atorvastatin (LIPITOR) 10 MG tablet Take 10 mg by mouth daily. 09/07/21  Yes [provider]  ibuprofen (ADVIL) 600 MG tablet Take 1 tablet (600 mg total) by mouth every 8 (eight) hours as needed for moderate pain. 05/17/22  Yes Basheer Molchan, Elita Quick, MD  LANTUS SOLOSTAR 100 UNIT/ML Solostar Pen  Inject 26 Units into the skin at bedtime. 09/07/21  Yes [provider]  liraglutide (VICTOZA) 18 MG/3ML SOPN Inject 1.2 mg into the skin once a week.   Yes [provider]  meloxicam (MOBIC) 15 MG tablet Take 15 mg by mouth daily as needed. 12/16/19  Yes [provider]  metFORMIN (GLUCOPHAGE) 1000 MG tablet Take 1,000 mg by mouth 2 (two) times daily. 12/16/19  Yes [provider]  tirzepatide Greggory Keen) 5 MG/0.5ML Pen Inject 5 mg into the skin once a week. Thursday   Yes [provider]    Allergies: Allergies  Allergen Reactions   Penicillins Rash    Review of Systems: Review of Systems  Constitutional:  Negative for chills and fever.  Respiratory:  Negative for shortness of breath.   Cardiovascular:  Negative for chest pain.  Gastrointestinal:  Negative for nausea and vomiting.  Skin:        Pulling/itching at sacral wound    Physical Exam BP 119/78 (BP Location: Right Arm, Patient Position: Sitting, Cuff Size: Large)   Pulse 99   Temp 98.7 F (37.1 C) (Oral)   Ht 5\' 6"  (1.676 m)   Wt 212 lb 12.8 oz (96.5 kg)   SpO2 97%   BMI 34.35 kg/m  CONSTITUTIONAL: No acute distress HEENT:  Normocephalic, atraumatic, extraocular motion intact. RESPIRATORY:  Normal respiratory effort without pathologic use of accessory  muscles. CARDIOVASCULAR: Regular rhythm and rate. SKIN:  Sacral wound is well healed with no evidence of any open wound, drainage, erythema, or fluctuance.  Not tender to palpation. NEUROLOGIC:  Motor and sensation is grossly normal.  Cranial nerves are grossly intact. PSYCH:  Alert and oriented to person, place and time. Affect is normal.   Assessment and Plan: This is a 62 y.o. female s/p debridement of sacral decubitus wound.  --Discussed with the patient that her wound has healed well, without any evidence of any dehiscence, infection, or any complication.  No intervention is needed.  Likely the pulling/discomfort  sensation is related to the scar tissue itself.  Recommended that she try moisturizing lotion, vitamin E lotion, or cocoa butter lotion. --Discussed with the patient that we do not perform weight loss surgery.  Her PCP has sent referrals already but she was wondering if we did that type of surgery. --Follow up as needed.  I spent 10 minutes dedicated to the care of this patient on the date of this encounter to include pre-visit review of records, face-to-face time with the patient discussing diagnosis and management, and any post-visit coordination of care.   Howie Ill, MD [facility]

## 2023-02-13 NOTE — Patient Instructions (Signed)
If you have any concerns or questions, please feel free to call our office.   Lipoma Removal, Care After The following information offers guidance on how to care for yourself after your procedure. Your health care provider may also give you more specific instructions. If you have problems or questions, contact your health care provider. What can I expect after the procedure? After the procedure, it is common to have: Mild pain. Swelling. Bruising. Follow these instructions at home: Bathing  Do not take baths, swim, or use a hot tub until your health care provider approves. Ask your health care provider if you may take showers. You may only be allowed to take sponge baths. Keep your bandage (dressing) clean and dry until your health care provider says it can be removed. Incision care  Follow instructions from your health care provider about how to take care of your incision. Make sure you: Wash your hands with soap and water for at least 20 seconds before and after you change your dressing. If soap and water are not available, use hand sanitizer. Change your dressing as told by your health care provider. Leave stitches (sutures), skin glue, or adhesive strips in place. These skin closures may need to stay in place for 2 weeks or longer. If adhesive strip edges start to loosen and curl up, you may trim the loose edges. Do not remove adhesive strips completely unless your health care provider tells you to do that. Check your incision area every day for signs of infection. Check for: More redness, swelling, or pain. Fluid or blood. Warmth. Pus or a bad smell. Medicines Take over-the-counter and prescription medicines only as told by your health care provider. If you were prescribed an antibiotic medicine, use it as told by your health care provider. Do not stop using the antibiotic even if you start to feel better. General instructions  If you were given a sedative during the procedure, it  can affect you for several hours. Do not drive or operate machinery until your health care provider says that it is safe. Do not use any products that contain nicotine or tobacco before the procedure. These products include cigarettes, chewing tobacco, and vaping devices, such as e-cigarettes. These can delay healing after surgery. If you need help quitting, ask your health care provider. Return to your normal activities as told by your health care provider. Ask your health care provider what activities are safe for you. Keep all follow-up visits. This is important. Contact a health care provider if: You have more redness, swelling, or pain around your incision. You have fluid or blood coming from your incision. Your incision feels warm to the touch. You have pus or a bad smell coming from your incision. You have pain that does not get better with medicine. Get help right away if: You have chills or a fever. You have severe pain. Summary After the procedure, it is common to have mild pain, swelling, and bruising. Follow instructions from your health care provider about how to take care of your incision. Contact a health care provider if you have signs of infection such as more redness, swelling, or pain. This information is not intended to replace advice given to you by your health care provider. Make sure you discuss any questions you have with your health care provider. Document Revised: 04/30/2021 Document Reviewed: 04/30/2021 Elsevier Patient Education  2024 [employer].

## 2023-02-20 ENCOUNTER — Ambulatory Visit: Payer: 59 | Admitting: Surgery

## 2023-04-06 ENCOUNTER — Other Ambulatory Visit: Payer: Self-pay | Admitting: Family Medicine

## 2023-04-06 DIAGNOSIS — Z1231 Encounter for screening mammogram for malignant neoplasm of breast: Secondary | ICD-10-CM

## 2023-05-22 ENCOUNTER — Ambulatory Visit
Admission: RE | Admit: 2023-05-22 | Discharge: 2023-05-22 | Disposition: A | Discharge status: Home / Self Care | Payer: 59 | Source: Ambulatory Visit | Attending: Family Medicine | Admitting: Family Medicine

## 2023-05-22 DIAGNOSIS — Z1231 Encounter for screening mammogram for malignant neoplasm of breast: Secondary | ICD-10-CM | POA: Diagnosis present

## 2023-06-16 ENCOUNTER — Encounter: Payer: Self-pay | Admitting: *Deleted

## 2023-07-17 ENCOUNTER — Telehealth: Payer: Self-pay

## 2023-07-17 DIAGNOSIS — Z87891 Personal history of nicotine dependence: Secondary | ICD-10-CM

## 2023-07-17 DIAGNOSIS — F17210 Nicotine dependence, cigarettes, uncomplicated: Secondary | ICD-10-CM

## 2023-07-17 DIAGNOSIS — Z122 Encounter for screening for malignant neoplasm of respiratory organs: Secondary | ICD-10-CM

## 2023-07-17 NOTE — Telephone Encounter (Signed)
 Lung Cancer Screening Narrative/Criteria Questionnaire (Cigarette Smokers Only- No Cigars/Pipes/vapes)   Jamie Rodriguez   SDMV:08/16/2023 at 10:00 am with [facility]       [birth year]   LDCT: 08/17/2023 at 9:30 am at [facility]    63 y.o.   Phone: [phone]  Lung Screening Narrative (confirm age 47-77 yrs Medicare / 50-80 yrs Private pay insurance)   Insurance information: [employer]   Referring Provider: Marvis Moeller, MD   This screening involves an initial phone call with a team member from our program. It is called a shared decision making visit. The initial meeting is required by  insurance and Medicare to make sure you understand the program. This appointment takes about 15-20 minutes to complete. You will complete the screening scan at your scheduled date/time.  This scan takes about 5-10 minutes to complete. You can eat and drink normally before and after the scan.  Criteria questions for Lung Cancer Screening:   Are you a current or former smoker? Current Age began smoking: 18   If you are a former smoker, what year did you quit smoking? Quit 3 years (within 15 yrs)   To calculate your smoking history, I need an accurate estimate of how many packs of cigarettes you smoked per day and for how many years. (Not just the number of PPD you are now smoking)   Years smoking 44 x Packs per day .5 = Pack years 22   (at least 20 pack yrs)   (Make sure they understand that we need to know how much they have smoked in the past, not just the number of PPD they are smoking now)  Do you have a personal history of cancer?  No    Do you have a family history of cancer? Yes  (cancer type and and relative) Sister Breast Cancer Mother Stomach Cancer   Are you coughing up blood?  No  Have you had unexplained weight loss of 15 lbs or more in the last 6 months? No  It looks like you meet all criteria.  When would be a good time for [place] to schedule you for this screening?   Additional information: N/A

## 2023-08-16 ENCOUNTER — Encounter: Payer: Self-pay | Admitting: Adult Health

## 2023-08-16 ENCOUNTER — Ambulatory Visit: Admitting: Adult Health

## 2023-08-16 DIAGNOSIS — F17210 Nicotine dependence, cigarettes, uncomplicated: Secondary | ICD-10-CM | POA: Diagnosis not present

## 2023-08-16 NOTE — Patient Instructions (Signed)

## 2023-08-16 NOTE — Progress Notes (Signed)
  Virtual Visit via Telephone Note  I connected with Kathrene Sinopoli Fiallo , 08/16/23 10:06 AM by a telemedicine application and verified that I am speaking with the correct person using two identifiers.  Location: Patient: home Provider: home   I discussed the limitations of evaluation and management by telemedicine and the availability of in person appointments. The patient expressed understanding and agreed to proceed.   Shared Decision Making Visit Lung Cancer Screening Program [phone])   Eligibility: 63 y.o. Pack Years Smoking History Calculation = 22 pack years  (# packs/per year x # years smoked) Recent History of coughing up blood  no Unexplained weight loss? no ( >Than 15 pounds within the last 6 months ) Prior History Lung / other cancer no (Diagnosis within the last 5 years already requiring surveillance chest CT Scans). Smoking Status Current Smoker  Visit Components: Discussion included one or more decision making aids. YES Discussion included risk/benefits of screening. YES Discussion included potential follow up diagnostic testing for abnormal scans. YES Discussion included meaning and risk of over diagnosis. YES Discussion included meaning and risk of False Positives. YES Discussion included meaning of total radiation exposure. YES  Counseling Included: Importance of adherence to annual lung cancer LDCT screening. YES Impact of comorbidities on ability to participate in the program. YES Ability and willingness to under diagnostic treatment. YES  Smoking Cessation Counseling: Current Smokers:  Discussed importance of smoking cessation. yes Information about tobacco cessation classes and interventions provided to patient. yes Patient provided with "ticket" for LDCT Scan. yes Symptomatic Patient. NO Diagnosis Code: Tobacco Use Z72.0 Asymptomatic Patient yes  Counseling (Intermediate counseling: > three minutes counseling) U0454 (CT Chest Lung Cancer Screening Low  Dose W/O CM) [id number]  Z12.2-Screening of respiratory organs Z87.891-Personal history of nicotine dependence   [place] Dose 08/16/23

## 2023-08-17 ENCOUNTER — Ambulatory Visit
Admission: RE | Admit: 2023-08-17 | Discharge: 2023-08-17 | Disposition: A | Discharge status: Home / Self Care | Source: Ambulatory Visit | Attending: Family Medicine | Admitting: Family Medicine

## 2023-08-17 DIAGNOSIS — Z122 Encounter for screening for malignant neoplasm of respiratory organs: Secondary | ICD-10-CM | POA: Diagnosis present

## 2023-08-17 DIAGNOSIS — Z87891 Personal history of nicotine dependence: Secondary | ICD-10-CM | POA: Diagnosis present

## 2023-08-17 DIAGNOSIS — F17210 Nicotine dependence, cigarettes, uncomplicated: Secondary | ICD-10-CM | POA: Diagnosis present

## 2023-09-11 ENCOUNTER — Other Ambulatory Visit: Payer: Self-pay | Admitting: Acute Care

## 2023-09-11 DIAGNOSIS — Z87891 Personal history of nicotine dependence: Secondary | ICD-10-CM

## 2023-09-11 DIAGNOSIS — Z122 Encounter for screening for malignant neoplasm of respiratory organs: Secondary | ICD-10-CM

## 2023-09-11 DIAGNOSIS — F17210 Nicotine dependence, cigarettes, uncomplicated: Secondary | ICD-10-CM
